# Patient Record
Sex: Male | Born: 1970 | Hispanic: No | Marital: Married | Smoking: Current some day smoker
Health system: Southern US, Community
[De-identification: ages and names within clinical notes are randomized; demographics above are authoritative.]

## PROBLEM LIST (undated history)

## (undated) DIAGNOSIS — T7840XA Allergy, unspecified, initial encounter: Secondary | ICD-10-CM

## (undated) DIAGNOSIS — A048 Other specified bacterial intestinal infections: Secondary | ICD-10-CM

## (undated) HISTORY — DX: Allergy, unspecified, initial encounter: T78.40XA

## (undated) HISTORY — DX: Other specified bacterial intestinal infections: A04.8

## (undated) HISTORY — PX: TONSILLECTOMY: SUR1361

---

## 2014-07-30 ENCOUNTER — Ambulatory Visit: Payer: Self-pay | Attending: Internal Medicine

## 2014-08-06 ENCOUNTER — Ambulatory Visit: Payer: Self-pay | Attending: Internal Medicine

## 2015-01-18 ENCOUNTER — Encounter (HOSPITAL_COMMUNITY): Payer: Self-pay | Admitting: Emergency Medicine

## 2015-01-18 ENCOUNTER — Emergency Department (HOSPITAL_COMMUNITY)
Admission: EM | Admit: 2015-01-18 | Discharge: 2015-01-18 | Disposition: A | Discharge status: Home / Self Care | Payer: Self-pay | Attending: Emergency Medicine | Admitting: Emergency Medicine

## 2015-01-18 ENCOUNTER — Emergency Department (HOSPITAL_COMMUNITY): Payer: Self-pay

## 2015-01-18 DIAGNOSIS — J189 Pneumonia, unspecified organism: Secondary | ICD-10-CM

## 2015-01-18 DIAGNOSIS — J029 Acute pharyngitis, unspecified: Secondary | ICD-10-CM | POA: Insufficient documentation

## 2015-01-18 DIAGNOSIS — H578 Other specified disorders of eye and adnexa: Secondary | ICD-10-CM | POA: Insufficient documentation

## 2015-01-18 DIAGNOSIS — Z792 Long term (current) use of antibiotics: Secondary | ICD-10-CM | POA: Insufficient documentation

## 2015-01-18 DIAGNOSIS — J159 Unspecified bacterial pneumonia: Secondary | ICD-10-CM | POA: Insufficient documentation

## 2015-01-18 DIAGNOSIS — Z79899 Other long term (current) drug therapy: Secondary | ICD-10-CM | POA: Insufficient documentation

## 2015-01-18 LAB — RAPID STREP SCREEN (MED CTR MEBANE ONLY): STREPTOCOCCUS, GROUP A SCREEN (DIRECT): NEGATIVE

## 2015-01-18 MED ORDER — AZITHROMYCIN 250 MG PO TABS: 250.0000 mg | ORAL_TABLET | Freq: Every day | ORAL | Status: DC

## 2015-01-18 MED ORDER — ALBUTEROL SULFATE HFA 108 (90 BASE) MCG/ACT IN AERS
2.0000 | INHALATION_SPRAY | Freq: Once | RESPIRATORY_TRACT | Status: AC
  Administered 2015-01-18: 2 via RESPIRATORY_TRACT
  Filled 2015-01-18: qty 6.7

## 2015-01-18 NOTE — ED Notes (Signed)
Pt. Stated, My nose its runny , my eyes are itchy, and my body feels like achy

## 2015-01-18 NOTE — Discharge Instructions (Signed)
1. Medications: azithromycin, usual home medications 2. Treatment: rest, drink plenty of fluids  3. Follow Up: please followup with your primary doctor for discussion of your diagnoses and further evaluation after today's visit; if you do not have a primary care doctor use the resource guide provided to find one; please return to the ER for high fever, severe chest pain or shortness of breath, new or worsening symptoms   Community-Acquired Pneumonia, Adult Pneumonia is an infection of the lungs. One type of pneumonia can happen while a person is in a hospital. A different type can happen when a person is not in a hospital (community-acquired pneumonia). It is easy for this kind to spread from person to person. It can spread to you if you breathe near an infected person who coughs or sneezes. Some symptoms include:  A dry cough.  A wet (productive) cough.  Fever.  Sweating.  Chest pain. HOME CARE  Take over-the-counter and prescription medicines only as told by your doctor.  Only take cough medicine if you are losing sleep.  If you were prescribed an antibiotic medicine, take it as told by your doctor. Do not stop taking the antibiotic even if you start to feel better.  Sleep with your head and neck raised (elevated). You can do this by putting a few pillows under your head, or you can sleep in a recliner.  Do not use tobacco products. These include cigarettes, chewing tobacco, and e-cigarettes. If you need help quitting, ask your doctor.  Drink enough water to keep your pee (urine) clear or pale yellow. A shot (vaccine) can help prevent pneumonia. Shots are often suggested for:  People older than 44 years of age.  People older than 44 years of age:  Who are having cancer treatment.  Who have long-term (chronic) lung disease.  Who have problems with their body's defense system (immune system). You may also prevent pneumonia if you take these actions:  Get the flu (influenza)  shot every year.  Go to the dentist as often as told.  Wash your hands often. If soap and water are not available, use hand sanitizer. GET HELP IF:  You have a fever.  You lose sleep because your cough medicine does not help. GET HELP RIGHT AWAY IF:  You are short of breath and it gets worse.  You have more chest pain.  Your sickness gets worse. This is very serious if:  You are an older adult.  Your body's defense system is weak.  You cough up blood.   This information is not intended to replace advice given to you by your health care provider. Make sure you discuss any questions you have with your health care provider.   Document Released: 08/31/2007 Document Revised: 12/03/2014 Document Reviewed: 07/09/2014 Elsevier Interactive Patient Education 2016 [employer].   Emergency Department Resource Guide 1) Find a Doctor and Pay Out of Pocket Although you won't have to find out who is covered by your insurance plan, it is a good idea to ask around and get recommendations. You will then need to call the office and see if the doctor you have chosen will accept you as a new patient and what types of options they offer for patients who are self-pay. Some doctors offer discounts or will set up payment plans for their patients who do not have insurance, but you will need to ask so you aren't surprised when you get to your appointment.  2) Contact Your Local Health Department Not  all health departments have doctors that can see patients for sick visits, but many do, so it is worth a call to see if yours does. If you don't know where your local health department is, you can check in your phone book. The CDC also has a tool to help you locate your state's health department, and many state websites also have listings of all of their local health departments.  3) Find a Walk-in Clinic If your illness is not likely to be very severe or complicated, you may want to try a walk in clinic.  These are popping up all over the country in pharmacies, drugstores, and shopping centers. They're usually staffed by nurse practitioners or physician assistants that have been trained to treat common illnesses and complaints. They're usually fairly quick and inexpensive. However, if you have serious medical issues or chronic medical problems, these are probably not your best option.  No Primary Care Doctor: - Call Health Connect at  [phone] - they can help you locate a primary care doctor that  accepts your insurance, provides certain services, etc. - Physician Referral Service- [phone]  Chronic Pain Problems: Organization         Address  Phone   Notes  [facility]  [phone] Patients need to be referred by their primary care doctor.   Medication Assistance: Organization         Address  Phone   Notes  [facility]ication [facility] [address] [place], [place] [address] [phone] --Must be a resident of [facility] -- Must have NO insurance coverage whatsoever (no Medicaid/ Medicare, etc.) -- The pt. MUST have a primary care doctor that directs their care regularly and follows them in the community   [facility]ist  [phone]   [employer]  [phone]    Agencies that provide inexpensive medical care: Organization         Address  Phone   Notes  [facility]  [phone]   [facility]    [phone]   [facility] [address] [place], [place] [address] [phone]   [facility] [address], [place] [phone]   Planned Parenthood    [phone]   [facility]    [phone]) [phone]   [facility]  [address], [place] Phone:  [phone]) [phone], Fax:  [phone] Hours of Operation:  9 am - 6 pm, M-F.  Also accepts Medicaid/Medicare and self-pay.  [facility]  [address], [place] Phone: [phone], Fax: [phone]. Hours of Operation:  8:30 am - 5:30 pm, M-F.  Also accepts Medicaid and self-pay.  [facility] [place] [address], [place] Phone: [phone]) [phone]   [facility] [address], [place], [place] [phone], Ext. [phone] Mondays & Thursdays: 7-9 AM.  First 15 patients are seen on a first come, first serve basis.    Medicaid-accepting [facility] Providers:  Organization         Address  Phone   Notes  [facility] [address], [place] [phone] Also accepts self-pay patients.  [facility] [address], [place]  [phone]   [facility] [address], [place] [phone]   [facility] [address], [place] [phone]   Renaye Rakers [address], [place]   [phone] Only accepts [place] Access [place] patients after they have their name applied to their card.   Self-Pay (no insurance) in [facility]:  Organization         Address  Phone   Notes  Sickle Cell Patients, [facility] [address] [place], [place] [phone]   [facility] [address] [place], [place] [phone]   [facility] [place]  [address], [place] [phone]   [facility]/Dr. Osei-Bonsu  [address], [place] or [address], [place] [phone] Phone number for both [place] and [place] locations is the same.  [facility] [address], [place] [phone]   [facility] [address], [place] or [address] [phone]   [facility] [address] [place] [address] [place] [phone], phone; [phone], fax Sees patients 1st  and 3rd Saturday of every month.  Must not qualify for public or private insurance (i.e. Medicaid, Medicare, [place] Health Choice, Veterans' Benefits)  Household income should be no more than 200% of the poverty level The clinic cannot treat you if you are pregnant or think you are pregnant  Sexually transmitted diseases are not treated at the clinic.    Dental Care: Organization         Address  Phone  Notes  [facility] [address] [place], [place] [phone] Accepts children up to age 35 who are enrolled in [place] or [place] Health Choice; pregnant women with a Medicaid card; and children who have applied for Medicaid or [place] Health Choice, but were declined, whose parents can pay a reduced fee at time of service.  [facility] of [facility]  [address], [place] [phone] Accepts children up to age 37 who are enrolled in [place] or [place] Health Choice; pregnant women with a Medicaid card; and children who have applied for Medicaid or [place] Health Choice, but were declined, whose parents can pay a reduced fee at time of service.  [facility]  [address] [place], [place] [phone] Patients are seen by appointment only. Walk-ins are not accepted. [facility] will see patients 57 years of age and older. Monday - Tuesday (8am-5pm) Most Wednesdays (8:30-5pm) $30 per visit, cash only  [facility] Adult Dental Access PROGRAM  [address], [facility] [phone] Patients are seen by appointment only. Walk-ins are not accepted. [facility] will see patients 45 years of age and older. One Wednesday Evening (Monthly: Volunteer Based).  $30 per visit, cash only  [employer]  [phone] for adults; Children under age 82, call Graduate Pediatric Dentistry at [phone]. Children aged 80-14, please call [phone] to request a pediatric  application.  Dental services are provided in all areas of dental care including fillings, crowns and bridges, complete and partial dentures, implants, gum treatment, root canals, and extractions. Preventive care is also provided. Treatment is provided to both adults and children. Patients are selected via a lottery and there is often a waiting list.   [facility] [address] [facility]  [phone] [email or web address]  [facility] [address] [place], [place] [phone], Ext. [phone] Second and Fourth Thursday of each month, opens at 6:30 AM; Clinic ends at 9 AM.  Patients are seen on a first-come first-served basis, and a limited number are seen during each clinic.   [facility]  [address] [place], [place] [phone]   Eligibility Requirements You must have lived in [place], [place], or [place] counties for at least the last three months.   You cannot be eligible for state or federal sponsored [place], including [employer], [place], or [employer].   You generally cannot be eligible for healthcare insurance through your employer.    How to apply: Eligibility screenings are held every Tuesday and Wednesday afternoon from 1:00 pm until 4:00 pm. You do not need an appointment for the interview!  [facility] [address], [place], [place] [phone]   [facility] Health Department  [phone]   [facility] Health Department  [phone]   [facility] Health Department  [phone]    Behavioral Health Resources in the Community: Intensive Outpatient Programs Organization         Address  Phone  Notes  [facility] [address], [place], [place] [phone]   [facility] [address] [place], [place] [phone]   [facility] [address], [place], [place]  [phone]   [facility]  [address],  [place], [place] [phone] or [phone]   Substance Abuse Resources Organization         Address  Phone  Notes  [facility]  [phone]   [facility]  [phone]   [facility] [place]  [phone]   Floydene Flock  [phone]   Residential & Outpatient Substance Abuse Program  [phone]   Psychological Services Organization         Address  Phone  Notes  [facility]  [phone]   [facility]  [phone]   [facility] Mental Health [address] [place] [phone] or [phone]    Mobile Crisis Teams Organization         Address  Phone  Notes  [facility], Mobile Crisis Care Unit  [phone]   [facility]  [address] [place], [place] [phone]   Doristine Locks [address] [place] [phone]    Self-Help/Support Groups Organization         Address  Phone             Notes  [facility] [place] - variety of support groups  336- I7437963 Call for more information  Narcotics Anonymous (NA), [facility] [address], [employer] [place]  2 meetings at this location   Statistician         Address  Phone  Notes  [facility] [address],    [place]  [phone]   [facility]  [address], [place] [address], [place], [place] [phone]   [facility] [address] [place], [place] [phone] Admissions: 8am-3pm M-F  [facility] [address]    [place], [place] [phone]   [facility] [address] [place], [place] [phone]   [facility] [address],  [place], [place] [phone]   [facility] - Intensive Outpatient [phone]  [address] [place], [place] [phone]   [facility] ([facility]iction [facility] [address]    [place], [place] [phone] or [phone]   [facility] ([facility]) [address], [place], [place] [phone] Accepts Medicaid  [facility] [address],  [place] [phone] Substance Abuse/Addiction Treatment   [facility] Organization         Address  Phone  Notes  [facility]  [phone]   Angie Fava, PhD [address] Ervin Knack [place], [place]   [phone] or [phone]   [facility] Behavioral   [address] [place], [place] [phone]   [facility] [address] [place], [place] [phone] Insurance/Medicaid/sponsorship through [facility] Families [address]                                    [place], [place] [phone] Therapy/tele-psych/case  [facility] [address], [place] [phone]    Dr. Lolly Mustache  [phone]   [facility]  [employer] [facility] Dept. 1) [address], [place] 2) [address] [place] 3)  [address], [place] [phone]  [phone]   [facility] Child Abuse Hotline 301-550-5416 or [phone] (After Hours)

## 2015-01-18 NOTE — ED Provider Notes (Signed)
CSN: [id number]     Arrival date & time 01/18/15  1320 History   First MD Initiated Contact with Patient 01/18/15 1336     Chief Complaint  Patient presents with  . Nasal Congestion  . Allergies  . Generalized Body Aches     HPI   Kyle Herrera is a 44 y.o. male with no pertinent PMH who presents to the ED with nasal congestion, itchy eyes, sneezing, and dry cough x 2-3 days. He states he has a history of allergies, and that this feels similar. He denies exacerbating factors. He has tried his home albuterol inhaler and zyrtec for symptom relief. He denies fever, chills, chest pain. He reports intermittent shortness of breath, which he states he has with allergies. He denies abdominal pain, N/V/D/C. He reports generalized body aches.   History reviewed. No pertinent past medical history. History reviewed. No pertinent past surgical history. No family history on file. Social History  Substance Use Topics  . Smoking status: Never Smoker   . Smokeless tobacco: None  . Alcohol Use: No    Review of Systems  Constitutional: Negative for fever and chills.  HENT: Positive for congestion, rhinorrhea, sneezing and sore throat. Negative for ear discharge and ear pain.   Eyes: Positive for itching.  Respiratory: Positive for shortness of breath.   Cardiovascular: Negative for chest pain.  Gastrointestinal: Negative for nausea, vomiting, abdominal pain, diarrhea and constipation.  Neurological: Negative for dizziness, light-headedness and headaches.  All other systems reviewed and are negative.     Allergies  Review of patient's allergies indicates no known allergies.  Home Medications   Prior to Admission medications   Medication Sig Start Date End Date Taking? Authorizing Provider  albuterol (PROVENTIL HFA;VENTOLIN HFA) 108 (90 BASE) MCG/ACT inhaler Inhale 1-2 puffs into the lungs every 6 (six) hours as needed for wheezing or shortness of breath.   Yes Historical Provider, MD   cetirizine (ZYRTEC) 10 MG tablet Take 10 mg by mouth daily.   Yes Historical Provider, MD    BP 118/78 mmHg  Pulse 72  Temp(Src) 97.8 F (36.6 C) (Oral)  Resp 16  Wt 181 lb 12.8 oz (82.464 kg)  SpO2 97% Physical Exam  Constitutional: He is oriented to person, place, and time. He appears well-developed and well-nourished. No distress.  HENT:  Head: Normocephalic and atraumatic.  Right Ear: Tympanic membrane, external ear and ear canal normal.  Left Ear: Tympanic membrane, external ear and ear canal normal.  Nose: Nose normal.  Mouth/Throat: Uvula is midline, oropharynx is clear and moist and mucous membranes are normal. No oropharyngeal exudate.  Eyes: Conjunctivae, EOM and lids are normal. Pupils are equal, round, and reactive to light. Right eye exhibits no discharge. Left eye exhibits no discharge. No scleral icterus.  Neck: Normal range of motion. Neck supple.  Cardiovascular: Normal rate, regular rhythm, normal heart sounds, intact distal pulses and normal pulses.   Pulmonary/Chest: Effort normal and breath sounds normal. No respiratory distress. He has no wheezes. He has no rales.  Abdominal: Soft. Normal appearance and bowel sounds are normal. He exhibits no distension and no mass. There is no tenderness. There is no rigidity, no rebound and no guarding.  Musculoskeletal: Normal range of motion. He exhibits no edema or tenderness.  Neurological: He is alert and oriented to person, place, and time.  Skin: Skin is warm, dry and intact. No rash noted. He is not diaphoretic. No erythema. No pallor.  Psychiatric: He has a normal mood  and affect. His speech is normal and behavior is normal.  Nursing note and vitals reviewed.   ED Course  Procedures (including critical care time)  Labs Review Labs Reviewed  RAPID STREP SCREEN (NOT AT [facility])  CULTURE, GROUP A STREP    Imaging Review Dg Chest 2 View  01/18/2015  CLINICAL DATA:  Sneezing, dry cough and runny nose for past few  days, shortness of breath, chest congestion, difficulty sleeping, trouble breathing EXAM: CHEST  2 VIEW COMPARISON:  None FINDINGS: Normal heart size, mediastinal contours, and pulmonary vascularity. Mild peribronchial thickening. Question subtle RIGHT upper lobe infiltrate. Remaining lungs clear. No pleural effusion or pneumothorax. Bones unremarkable. IMPRESSION: Bronchitic changes with questionable RIGHT upper lobe infiltrate. Electronically Signed   By: Ulyses SouthwardMark  Boles M.D.   On: 01/18/2015 15:09     I have personally reviewed and evaluated these images and lab results as part of my medical decision-making.   EKG Interpretation None      MDM   Final diagnoses:  CAP (community acquired pneumonia)    44 year old male presents with nasal congestion, itchy eyes, sneezing, and dry cough x 2-3 days. Reports associated sore throat and shortness of breath. States he has experienced similar symptoms in the past with allergies. Patient is afebrile. Vital signs stable. TMs clear. No nasal discharge. Posterior oropharynx clear. Heart RRR. Lungs clear to auscultation bilaterally. Abdomen soft, non-tender, non-distended. No lower extremity edema.  Will obtain rapid strep and CXR. PERC negative. Strep test negative. CXR reveals bronchitic changes with questionable right upper lobe infiltrate. Will treat with azithromycin. Advised patient he can continue using his albuterol inhaler and zyrtec for additional symptom relief. Patient is well-appearing, feel he is stable for discharge at this time. Return precautions discussed. Patient to follow-up with PCP.   BP 112/86 mmHg  Pulse 71  Temp(Src) 97.8 F (36.6 C) (Oral)  Resp 16  Wt 181 lb 12.8 oz (82.464 kg)  SpO2 98%     Mady Gemmalizabeth C Gustabo Gordillo, PA-C 01/18/15 1548  Lyndal Pulleyaniel Knott, MD 01/19/15 [phone]

## 2015-01-18 NOTE — ED Notes (Signed)
Pt reports sneezing, dry cough and runny nose for the past few days. Denies any fevers at home, denies pain at this time.

## 2015-01-20 LAB — CULTURE, GROUP A STREP: Strep A Culture: NEGATIVE

## 2015-01-22 ENCOUNTER — Ambulatory Visit: Payer: Self-pay | Attending: Family Medicine | Admitting: Family Medicine

## 2015-01-22 ENCOUNTER — Encounter: Payer: Self-pay | Admitting: Family Medicine

## 2015-01-22 VITALS — BP 121/70 | HR 70 | Temp 97.8°F | Resp 16 | Ht 69.5 in | Wt 178.0 lb

## 2015-01-22 DIAGNOSIS — J189 Pneumonia, unspecified organism: Secondary | ICD-10-CM | POA: Insufficient documentation

## 2015-01-22 DIAGNOSIS — R918 Other nonspecific abnormal finding of lung field: Secondary | ICD-10-CM | POA: Insufficient documentation

## 2015-01-22 DIAGNOSIS — R05 Cough: Secondary | ICD-10-CM | POA: Insufficient documentation

## 2015-01-22 DIAGNOSIS — Z9109 Other allergy status, other than to drugs and biological substances: Secondary | ICD-10-CM | POA: Insufficient documentation

## 2015-01-22 DIAGNOSIS — J181 Lobar pneumonia, unspecified organism: Secondary | ICD-10-CM

## 2015-01-22 DIAGNOSIS — Z91048 Other nonmedicinal substance allergy status: Secondary | ICD-10-CM

## 2015-01-22 MED ORDER — MONTELUKAST SODIUM 10 MG PO TABS: 10.0000 mg | ORAL_TABLET | Freq: Every day | ORAL | Status: DC

## 2015-01-22 MED ORDER — CETIRIZINE HCL 10 MG PO TABS: 10.0000 mg | ORAL_TABLET | Freq: Every day | ORAL | Status: DC

## 2015-01-22 MED ORDER — ALBUTEROL SULFATE HFA 108 (90 BASE) MCG/ACT IN AERS: 1.0000 | INHALATION_SPRAY | Freq: Four times a day (QID) | RESPIRATORY_TRACT | Status: DC | PRN

## 2015-01-22 MED ORDER — FLUTICASONE PROPIONATE 50 MCG/ACT NA SUSP: 2.0000 | Freq: Every day | NASAL | Status: DC

## 2015-01-22 NOTE — Assessment & Plan Note (Signed)
A: environmental allergies suspected. No wheezing today P: Add flonase and singulair Allergist referral

## 2015-01-22 NOTE — Progress Notes (Signed)
HFU season allergie wheezing and unable to sleep due to it  No Hx tobacco

## 2015-01-22 NOTE — Patient Instructions (Signed)
Macky LowerSabah was seen today for allergies.  Diagnoses and all orders for this visit:  Right upper lobe pneumonia -     DG Chest 2 View; Future  Environmental allergies -     fluticasone (FLONASE) 50 MCG/ACT nasal spray; Place 2 sprays into both nostrils daily. -     montelukast (SINGULAIR) 10 MG tablet; Take 1 tablet (10 mg total) by mouth at bedtime. -     Ambulatory referral to Allergy -     cetirizine (ZYRTEC) 10 MG tablet; Take 1 tablet (10 mg total) by mouth daily. -     albuterol (PROVENTIL HFA;VENTOLIN HFA) 108 (90 BASE) MCG/ACT inhaler; Inhale 1-2 puffs into the lungs every 6 (six) hours as needed for wheezing or shortness of breath.   Add flonase nasal spray and nightly singulair to your regimen to control allergies.  CXR in 8 weeks around 03/15/2015 to check for resolution of R upper lung possible pneumonia.   F/u with me in 12 weeks  Dr. Armen PickupFunches  Allergies An allergy is when your body reacts to a substance in a way that is not normal. An allergic reaction can happen after you:  Eat something.  Breathe in something.  Touch something. WHAT KINDS OF ALLERGIES ARE THERE? You can be allergic to:  Things that are only around during certain seasons, like molds and pollens.  Foods.  Drugs.  Insects.  Animal dander. WHAT ARE SYMPTOMS OF ALLERGIES?  Puffiness (swelling). This may happen on the lips, face, tongue, mouth, or throat.  Sneezing.  Coughing.  Breathing loudly (wheezing).  Stuffy nose.  Tingling in the mouth.  A rash.  Itching.  Itchy, red, puffy areas of skin (hives).  Watery eyes.  Throwing up (vomiting).  Watery poop (diarrhea).  Dizziness.  Feeling faint or fainting.  Trouble breathing or swallowing.  A tight feeling in the chest.  A fast heartbeat. HOW ARE ALLERGIES DIAGNOSED? Allergies can be diagnosed with:  A medical and family history.  Skin tests.  Blood tests.  A food diary. A food diary is a record of all the foods,  drinks, and symptoms you have each day.  The results of an elimination diet. This diet involves making sure not to eat certain foods and then seeing what happens when you start eating them again. HOW ARE ALLERGIES TREATED? There is no cure for allergies, but allergic reactions can be treated with medicine. Severe reactions usually need to be treated at a hospital.  HOW CAN REACTIONS BE PREVENTED? The best way to prevent an allergic reaction is to avoid the thing you are allergic to. Allergy shots and medicines can also help prevent reactions in some cases.   This information is not intended to replace advice given to you by your health care provider. Make sure you discuss any questions you have with your health care provider.   Document Released: 07/09/2012 Document Revised: 04/04/2014 Document Reviewed: 12/24/2013 Elsevier Interactive Patient Education [employer]2016 Elsevier Inc.

## 2015-01-22 NOTE — Progress Notes (Signed)
Patient ID: Kyle Herrera, male   DOB: 1970/12/04, 44 y.o.   MRN: [id number]   Subjective:  Patient ID: Kyle Herrera, male    DOB: [birth year]  Age: 44 y.o. MRN: [id number]  CC: Allergies   HPI Roma Schanz Ahmed presents for ED f/u  1. ? R upper lobe PNA: infiltrate seen on CXR in setting of cough, subjective wheezing and sore throat. Patient has completed course of azithromycin today. He denies fever. He has intermittent non-productive cough. No CP or SOB.  2. Allergies: patient with intermittent allergies since moving from [place] to [place] 3 years ago. He endorses allergies have worsened 1 year ago [place] He coughs and wheezes when he walks near the [place] and when at home. He has no wheezing when he is away from home. His cough is dry. He has a wife and two children who do not have symptoms. He uses albuterol at home which helps temporarily. He also takes  Zyrtec.   No past medical history on file.  No past surgical history on file.  No family history on file.  Social History  Substance Use Topics  . Smoking status: Never Smoker   . Smokeless tobacco: Not on file  . Alcohol Use: No    ROS Review of Systems  Constitutional: Negative for fever, chills, fatigue and unexpected weight change.  Eyes: Negative for visual disturbance.  Respiratory: Positive for cough and wheezing. Negative for shortness of breath.   Cardiovascular: Negative for chest pain, palpitations and leg swelling.  Gastrointestinal: Negative for nausea, vomiting, abdominal pain, diarrhea, constipation and blood in stool.  Endocrine: Negative for polydipsia, polyphagia and polyuria.  Musculoskeletal: Negative for myalgias, back pain, arthralgias, gait problem and neck pain.  Skin: Negative for rash.  Allergic/Immunologic: Negative for immunocompromised state.  Hematological: Negative for adenopathy. Does not bruise/bleed easily.  Psychiatric/Behavioral: Negative for  suicidal ideas, sleep disturbance and dysphoric mood. The patient is not nervous/anxious.     Objective:   Today's Vitals: BP 121/70 mmHg  Pulse 70  Temp(Src) 97.8 F (36.6 C) (Oral)  Resp 16  Ht 5' 9.5" (1.765 m)  Wt 178 lb (80.74 kg)  BMI 25.92 kg/m2  SpO2 97%  Physical Exam  Constitutional: He appears well-developed and well-nourished. No distress.  HENT:  Head: Normocephalic and atraumatic.  Nose: Mucosal edema present.  Neck: Normal range of motion. Neck supple.  Cardiovascular: Normal rate, regular rhythm, normal heart sounds and intact distal pulses.   Pulmonary/Chest: Effort normal and breath sounds normal.  Musculoskeletal: He exhibits no edema.  Neurological: He is alert.  Skin: Skin is warm and dry. No rash noted. No erythema.  Psychiatric: He has a normal mood and affect.    Assessment & Plan:   No problem-specific assessment & plan notes found for this encounter.   Outpatient Encounter Prescriptions as of 01/22/2015  Medication Sig  . albuterol (PROVENTIL HFA;VENTOLIN HFA) 108 (90 BASE) MCG/ACT inhaler Inhale 1-2 puffs into the lungs every 6 (six) hours as needed for wheezing or shortness of breath.  Marland Kitchen azithromycin (ZITHROMAX Z-PAK) 250 MG tablet Take 1 tablet (250 mg total) by mouth daily.  PO day 1, then  PO days 2-5  . cetirizine (ZYRTEC) 10 MG tablet Take 1 tablet (10 mg total) by mouth daily.  . [DISCONTINUED] albuterol (PROVENTIL HFA;VENTOLIN HFA) 108 (90 BASE) MCG/ACT inhaler Inhale 1-2 puffs into the lungs every 6 (six) hours as needed for wheezing or shortness  of breath.  . [DISCONTINUED] cetirizine (ZYRTEC) 10 MG tablet Take 10 mg by mouth daily.  . fluticasone (FLONASE) 50 MCG/ACT nasal spray Place 2 sprays into both nostrils daily.  . montelukast (SINGULAIR) 10 MG tablet Take 1 tablet (10 mg total) by mouth at bedtime.   No facility-administered encounter medications on file as of 01/22/2015.    Follow-up: No Follow-up on file.     Dessa PhiJosalyn Lindbergh Winkles MD

## 2015-01-22 NOTE — Assessment & Plan Note (Signed)
?   CAP. Treated with azithromycin.   Repeat CXR in 8 weeks around 03/15/2015 to check for resolution of R upper lung possible pneumonia.

## 2015-02-04 ENCOUNTER — Ambulatory Visit: Payer: Medicaid Other | Attending: Family Medicine

## 2015-02-25 ENCOUNTER — Ambulatory Visit: Payer: Medicaid Other | Attending: Family Medicine

## 2015-02-25 ENCOUNTER — Emergency Department (HOSPITAL_COMMUNITY)
Admission: EM | Admit: 2015-02-25 | Discharge: 2015-02-25 | Disposition: A | Discharge status: Home / Self Care | Payer: Medicaid Other | Attending: Emergency Medicine | Admitting: Emergency Medicine

## 2015-02-25 ENCOUNTER — Emergency Department (HOSPITAL_COMMUNITY): Payer: Medicaid Other

## 2015-02-25 ENCOUNTER — Encounter (HOSPITAL_COMMUNITY): Payer: Self-pay | Admitting: Emergency Medicine

## 2015-02-25 DIAGNOSIS — R519 Headache, unspecified: Secondary | ICD-10-CM

## 2015-02-25 DIAGNOSIS — J069 Acute upper respiratory infection, unspecified: Secondary | ICD-10-CM

## 2015-02-25 DIAGNOSIS — Z7951 Long term (current) use of inhaled steroids: Secondary | ICD-10-CM | POA: Insufficient documentation

## 2015-02-25 DIAGNOSIS — R067 Sneezing: Secondary | ICD-10-CM

## 2015-02-25 DIAGNOSIS — Z79899 Other long term (current) drug therapy: Secondary | ICD-10-CM | POA: Insufficient documentation

## 2015-02-25 DIAGNOSIS — R51 Headache: Secondary | ICD-10-CM

## 2015-02-25 LAB — CBC WITH DIFFERENTIAL/PLATELET
BASOS PCT: 1 %
Basophils Absolute: 0 10*3/uL (ref 0.0–0.1)
EOS ABS: 0.4 10*3/uL (ref 0.0–0.7)
Eosinophils Relative: 7 %
HEMATOCRIT: 43.3 % (ref 39.0–52.0)
Hemoglobin: 14.9 g/dL (ref 13.0–17.0)
Lymphocytes Relative: 34 %
Lymphs Abs: 1.9 10*3/uL (ref 0.7–4.0)
MCH: 30.7 pg (ref 26.0–34.0)
MCHC: 34.4 g/dL (ref 30.0–36.0)
MCV: 89.3 fL (ref 78.0–100.0)
MONO ABS: 0.3 10*3/uL (ref 0.1–1.0)
MONOS PCT: 5 %
NEUTROS ABS: 3.1 10*3/uL (ref 1.7–7.7)
Neutrophils Relative %: 53 %
Platelets: 211 10*3/uL (ref 150–400)
RBC: 4.85 MIL/uL (ref 4.22–5.81)
RDW: 12.8 % (ref 11.5–15.5)
WBC: 5.8 10*3/uL (ref 4.0–10.5)

## 2015-02-25 LAB — BASIC METABOLIC PANEL
Anion gap: 5 (ref 5–15)
BUN: 10 mg/dL (ref 6–20)
CALCIUM: 9.3 mg/dL (ref 8.9–10.3)
CO2: 26 mmol/L (ref 22–32)
CREATININE: 0.93 mg/dL (ref 0.61–1.24)
Chloride: 108 mmol/L (ref 101–111)
GFR calc non Af Amer: >60 mL/min (ref >60)
Glucose, Bld: 133 mg/dL — ABNORMAL HIGH (ref 65–99)
Potassium: 3.2 mmol/L — ABNORMAL LOW (ref 3.5–5.1)
Sodium: 139 mmol/L (ref 135–145)

## 2015-02-25 MED ORDER — POTASSIUM CHLORIDE CRYS ER 20 MEQ PO TBCR
40.0000 meq | EXTENDED_RELEASE_TABLET | Freq: Once | ORAL | Status: AC
  Administered 2015-02-25: 40 meq via ORAL
  Filled 2015-02-25: qty 2

## 2015-02-25 NOTE — Discharge Instructions (Signed)
It was our pleasure to provide your ER care today - we hope that you feel better.  Rest. Drink plenty of fluids.  Take mucinex or robitussin as need for cough/congestion.  Take tylenol/advil as need.   From today's lab tests, your potassium level is mildly low (3.2) - eat plenty of fruits and vegetables, and follow up with primary care doctor.   Follow up with primary care doctor in 1 week if symptoms fail to improve/resolve.  Return to ER if worse, new symptoms, severe pain, increased trouble breathing, other concern.      Upper Respiratory Infection, Adult Most upper respiratory infections (URIs) are a viral infection of the air passages leading to the lungs. A URI affects the nose, throat, and upper air passages. The most common type of URI is nasopharyngitis and is typically referred to as "the common cold." URIs run their course and usually go away on their own. Most of the time, a URI does not require medical attention, but sometimes a bacterial infection in the upper airways can follow a viral infection. This is called a secondary infection. Sinus and middle ear infections are common types of secondary upper respiratory infections. Bacterial pneumonia can also complicate a URI. A URI can worsen asthma and chronic obstructive pulmonary disease (COPD). Sometimes, these complications can require emergency medical care and may be life threatening.  CAUSES Almost all URIs are caused by viruses. A virus is a type of germ and can spread from one person to another.  RISKS FACTORS You may be at risk for a URI if:   You smoke.   You have chronic heart or lung disease.  You have a weakened defense (immune) system.   You are very young or very old.   You have nasal allergies or asthma.  You work in crowded or poorly ventilated areas.  You work in health care facilities or schools. SIGNS AND SYMPTOMS  Symptoms typically develop 2-3 days after you come in contact with a cold  virus. Most viral URIs last 7-10 days. However, viral URIs from the influenza virus (flu virus) can last 14-18 days and are typically more severe. Symptoms may include:   Runny or stuffy (congested) nose.   Sneezing.   Cough.   Sore throat.   Headache.   Fatigue.   Fever.   Loss of appetite.   Pain in your forehead, behind your eyes, and over your cheekbones (sinus pain).  Muscle aches.  DIAGNOSIS  Your health care provider may diagnose a URI by:  Physical exam.  Tests to check that your symptoms are not due to another condition such as:  Strep throat.  Sinusitis.  Pneumonia.  Asthma. TREATMENT  A URI goes away on its own with time. It cannot be cured with medicines, but medicines may be prescribed or recommended to relieve symptoms. Medicines may help:  Reduce your fever.  Reduce your cough.  Relieve nasal congestion. HOME CARE INSTRUCTIONS   Take medicines only as directed by your health care provider.   Gargle warm saltwater or take cough drops to comfort your throat as directed by your health care provider.  Use a warm mist humidifier or inhale steam from a shower to increase air moisture. This may make it easier to breathe.  Drink enough fluid to keep your urine clear or pale yellow.   Eat soups and other clear broths and maintain good nutrition.   Rest as needed.   Return to work when your temperature has returned to  normal or as your health care provider advises. You may need to stay home longer to avoid infecting others. You can also use a face mask and careful hand washing to prevent spread of the virus.  Increase the usage of your inhaler if you have asthma.   Do not use any tobacco products, including cigarettes, chewing tobacco, or electronic cigarettes. If you need help quitting, ask your health care provider. PREVENTION  The best way to protect yourself from getting a cold is to practice good hygiene.   Avoid oral or hand  contact with people with cold symptoms.   Wash your hands often if contact occurs.  There is no clear evidence that vitamin C, vitamin E, echinacea, or exercise reduces the chance of developing a cold. However, it is always recommended to get plenty of rest, exercise, and practice good nutrition.  SEEK MEDICAL CARE IF:   You are getting worse rather than better.   Your symptoms are not controlled by medicine.   You have chills.  You have worsening shortness of breath.  You have brown or red mucus.  You have yellow or brown nasal discharge.  You have pain in your face, especially when you bend forward.  You have a fever.  You have swollen neck glands.  You have pain while swallowing.  You have white areas in the back of your throat. SEEK IMMEDIATE MEDICAL CARE IF:   You have severe or persistent:  Headache.  Ear pain.  Sinus pain.  Chest pain.  You have chronic lung disease and any of the following:  Wheezing.  Prolonged cough.  Coughing up blood.  A change in your usual mucus.  You have a stiff neck.  You have changes in your:  Vision.  Hearing.  Thinking.  Mood. MAKE SURE YOU:   Understand these instructions.  Will watch your condition.  Will get help right away if you are not doing well or get worse.   This information is not intended to replace advice given to you by your health care provider. Make sure you discuss any questions you have with your health care provider.   Document Released: 09/07/2000 Document Revised: 07/29/2014 Document Reviewed: 06/19/2013 Elsevier Interactive Patient Education 2016 Elsevier Inc.   Viral Infections A viral infection can be caused by different types of viruses.Most viral infections are not serious and resolve on their own. However, some infections may cause severe symptoms and may lead to further complications. SYMPTOMS Viruses can frequently cause:  Minor sore throat.  Aches and  pains.  Headaches.  Runny nose.  Different types of rashes.  Watery eyes.  Tiredness.  Cough.  Loss of appetite.  Gastrointestinal infections, resulting in nausea, vomiting, and diarrhea. These symptoms do not respond to antibiotics because the infection is not caused by bacteria. However, you might catch a bacterial infection following the viral infection. This is sometimes called a "superinfection." Symptoms of such a bacterial infection may include:  Worsening sore throat with pus and difficulty swallowing.  Swollen neck glands.  Chills and a high or persistent fever.  Severe headache.  Tenderness over the sinuses.  Persistent overall ill feeling (malaise), muscle aches, and tiredness (fatigue).  Persistent cough.  Yellow, green, or brown mucus production with coughing. HOME CARE INSTRUCTIONS   Only take over-the-counter or prescription medicines for pain, discomfort, diarrhea, or fever as directed by your caregiver.  Drink enough water and fluids to keep your urine clear or pale yellow. Sports drinks can provide valuable electrolytes,  sugars, and hydration.  Get plenty of rest and maintain proper nutrition. Soups and broths with crackers or rice are fine. SEEK IMMEDIATE MEDICAL CARE IF:   You have severe headaches, shortness of breath, chest pain, neck pain, or an unusual rash.  You have uncontrolled vomiting, diarrhea, or you are unable to keep down fluids.  You or your child has an oral temperature above 102 F (38.9 C), not controlled by medicine.  Your baby is older than 3 months with a rectal temperature of 102 F (38.9 C) or higher.  Your baby is 363 months old or younger with a rectal temperature of 100.4 F (38 C) or higher. MAKE SURE YOU:   Understand these instructions.  Will watch your condition.  Will get help right away if you are not doing well or get worse.   This information is not intended to replace advice given to you by your health  care provider. Make sure you discuss any questions you have with your health care provider.   Document Released: 12/22/2004 Document Revised: 06/06/2011 Document Reviewed: 08/20/2014 Elsevier Interactive Patient Education 2016 [employer]  Hypokalemia Hypokalemia means that the amount of potassium in the blood is lower than normal.Potassium is a chemical, called an electrolyte, that helps regulate the amount of fluid in the body. It also stimulates muscle contraction and helps nerves function properly.Most of the body's potassium is inside of cells, and only a very small amount is in the blood. Because the amount in the blood is so small, minor changes can be life-threatening. CAUSES  Antibiotics.  Diarrhea or vomiting.  Using laxatives too much, which can cause diarrhea.  Chronic kidney disease.  Water pills (diuretics).  Eating disorders (bulimia).  Low magnesium level.  Sweating a lot. SIGNS AND SYMPTOMS  Weakness.  Constipation.  Fatigue.  Muscle cramps.  Mental confusion.  Skipped heartbeats or irregular heartbeat (palpitations).  Tingling or numbness. DIAGNOSIS  Your health care provider can diagnose hypokalemia with blood tests. In addition to checking your potassium level, your health care provider may also check other lab tests. TREATMENT Hypokalemia can be treated with potassium supplements taken by mouth or adjustments in your current medicines. If your potassium level is very low, you may need to get potassium through a vein (IV) and be monitored in the hospital. A diet high in potassium is also helpful. Foods high in potassium are:  Nuts, such as peanuts and pistachios.  Seeds, such as sunflower seeds and pumpkin seeds.  Peas, lentils, and lima beans.  Whole grain and bran cereals and breads.  Fresh fruit and vegetables, such as apricots, avocado, bananas, cantaloupe, kiwi, oranges, tomatoes, asparagus, and potatoes.  Orange and tomato  juices.  Red meats.  Fruit yogurt. HOME CARE INSTRUCTIONS  Take all medicines as prescribed by your health care provider.  Maintain a healthy diet by including nutritious food, such as fruits, vegetables, nuts, whole grains, and lean meats.  If you are taking a laxative, be sure to follow the directions on the label. SEEK MEDICAL CARE IF:  Your weakness gets worse.  You feel your heart pounding or racing.  You are vomiting or having diarrhea.  You are diabetic and having trouble keeping your blood glucose in the normal range. SEEK IMMEDIATE MEDICAL CARE IF:  You have chest pain, shortness of breath, or dizziness.  You are vomiting or having diarrhea for more than 2 days.  You faint. MAKE SURE YOU:   Understand these instructions.  Will watch your condition.  Will get help right away if you are not doing well or get worse.   This information is not intended to replace advice given to you by your health care provider. Make sure you discuss any questions you have with your health care provider.   Document Released: 03/14/2005 Document Revised: 04/04/2014 Document Reviewed: 09/14/2012 Elsevier Interactive Patient Education [employer]

## 2015-02-25 NOTE — ED Notes (Signed)
Pt. reports SOB and headache onset today , denies fever or cough .

## 2015-02-25 NOTE — ED Provider Notes (Signed)
CSN: [id number]     Arrival date & time 02/25/15  1958 History   First MD Initiated Contact with Patient 02/25/15 2215     Chief Complaint  Patient presents with  . Shortness of Breath  . Headache     (Consider location/radiation/quality/duration/timing/severity/associated sxs/prior Treatment) Patient is a 44 y.o. male presenting with shortness of breath and headaches. The history is provided by the patient.  Shortness of Breath Associated symptoms: cough and headaches   Associated symptoms: no abdominal pain, no chest pain, no fever, no neck pain, no rash, no sore throat and no vomiting   Headache Associated symptoms: congestion and cough   Associated symptoms: no abdominal pain, no back pain, no fever, no neck pain, no sore throat and no vomiting   pt indicates started sneezing 2 days ago, nasal congestion, mild intermittent frontal headaches, and intermittent non prod cough. Felt mild sob earlier, now fine. No chest pain or discomfort. No fever or chills. States last month had pna, so wanted to get checked. No sore throat. No abd pain or nv. No swelling or leg pain.      History reviewed. No pertinent past medical history. History reviewed. No pertinent past surgical history. No family history on file. Social History  Substance Use Topics  . Smoking status: Never Smoker   . Smokeless tobacco: Never Used  . Alcohol Use: No    Review of Systems  Constitutional: Negative for fever.  HENT: Positive for congestion and sneezing. Negative for sore throat.   Eyes: Negative for discharge and redness.  Respiratory: Positive for cough and shortness of breath.   Cardiovascular: Negative for chest pain and leg swelling.  Gastrointestinal: Negative for vomiting and abdominal pain.  Genitourinary: Negative for flank pain.  Musculoskeletal: Negative for back pain and neck pain.  Skin: Negative for rash.  Neurological: Positive for headaches.  Hematological: Does not bruise/bleed  easily.  Psychiatric/Behavioral: Negative for confusion.      Allergies  Review of patient's allergies indicates no known allergies.  Home Medications   Prior to Admission medications   Medication Sig Start Date End Date Taking? Authorizing Provider  albuterol (PROVENTIL HFA;VENTOLIN HFA) 108 (90 BASE) MCG/ACT inhaler Inhale 1-2 puffs into the lungs every 6 (six) hours as needed for wheezing or shortness of breath. 01/22/15   Josalyn Funches, MD  azithromycin (ZITHROMAX Z-PAK) 250 MG tablet Take 1 tablet (250 mg total) by mouth daily.  PO day 1, then  PO days 2-5 01/18/15   Mady Gemma, PA-C  cetirizine (ZYRTEC) 10 MG tablet Take 1 tablet (10 mg total) by mouth daily. 01/22/15   Josalyn Funches, MD  fluticasone (FLONASE) 50 MCG/ACT nasal spray Place 2 sprays into both nostrils daily. 01/22/15   Josalyn Funches, MD  montelukast (SINGULAIR) 10 MG tablet Take 1 tablet (10 mg total) by mouth at bedtime. 01/22/15   Josalyn Funches, MD   BP 109/71 mmHg  Pulse 60  Temp(Src) 97.6 F (36.4 C) (Oral)  Resp 16  SpO2 98% Physical Exam  Constitutional: He is oriented to person, place, and time. He appears well-developed and well-nourished. No distress.  HENT:  Head: Atraumatic.  Mouth/Throat: Oropharynx is clear and moist.  No sinus or temporal tenderness.   Eyes: Conjunctivae are normal. Pupils are equal, round, and reactive to light.  Neck: Neck supple. No tracheal deviation present.  No stiffness or rigidity  Cardiovascular: Normal rate, regular rhythm, normal heart sounds and intact distal pulses.  Exam reveals no gallop and no  friction rub.   No murmur heard. Pulmonary/Chest: Effort normal and breath sounds normal. No accessory muscle usage. No respiratory distress.  Abdominal: Soft. Bowel sounds are normal. He exhibits no distension. There is no tenderness.  Musculoskeletal: Normal range of motion. He exhibits no edema or tenderness.  Neurological: He is alert and  oriented to person, place, and time.  Skin: Skin is warm and dry. No rash noted. He is not diaphoretic.  Psychiatric: He has a normal mood and affect.  Nursing note and vitals reviewed.   ED Course  Procedures (including critical care time) Labs Review  Results for orders placed or performed during the hospital encounter of 02/25/15  CBC with Differential  Result Value Ref Range   WBC 5.8 4.0 - 10.5 K/uL   RBC 4.85 4.22 - 5.81 MIL/uL   Hemoglobin 14.9 13.0 - 17.0 g/dL   HCT 16.143.3 09.639.0 - 04.552.0 %   MCV 89.3 78.0 - 100.0 fL   MCH 30.7 26.0 - 34.0 pg   MCHC 34.4 30.0 - 36.0 g/dL   RDW 40.912.8 81.111.5 - 91.415.5 %   Platelets 211 150 - 400 K/uL   Neutrophils Relative % 53 %   Neutro Abs 3.1 1.7 - 7.7 K/uL   Lymphocytes Relative 34 %   Lymphs Abs 1.9 0.7 - 4.0 K/uL   Monocytes Relative 5 %   Monocytes Absolute 0.3 0.1 - 1.0 K/uL   Eosinophils Relative 7 %   Eosinophils Absolute 0.4 0.0 - 0.7 K/uL   Basophils Relative 1 %   Basophils Absolute 0.0 0.0 - 0.1 K/uL  Basic metabolic panel  Result Value Ref Range   Sodium 139 135 - 145 mmol/L   Potassium 3.2 (L) 3.5 - 5.1 mmol/L   Chloride 108 101 - 111 mmol/L   CO2 26 22 - 32 mmol/L   Glucose, Bld 133 (H) 65 - 99 mg/dL   BUN 10 6 - 20 mg/dL   Creatinine, Ser 7.820.93 0.61 - 1.24 mg/dL   Calcium 9.3 8.9 - 95.610.3 mg/dL   GFR calc non Af Amer >60 >60 mL/min   GFR calc Af Amer >60 >60 mL/min   Anion gap 5 5 - 15   Dg Chest 2 View  02/25/2015  CLINICAL DATA:  Shortness of breath, headache EXAM: CHEST  2 VIEW COMPARISON:  01/18/2015 FINDINGS: The heart size and mediastinal contours are within normal limits. Both lungs are clear. The visualized skeletal structures are unremarkable. IMPRESSION: No active cardiopulmonary disease. Electronically Signed   By: Elige KoHetal  Patel   On: 02/25/2015 21:14   '   I have personally reviewed and evaluated these images and lab results as part of my medical decision-making.   EKG Interpretation   Date/Time:   Wednesday February 25 2015 20:08:32 EST Ventricular Rate:  77 PR Interval:  162 QRS Duration: 84 QT Interval:  360 QTC Calculation: 407 R Axis:   92 Text Interpretation:  Normal sinus rhythm Rightward axis Nonspecific T  wave abnormality No previous tracing Confirmed by Denton LankSTEINL  MD, Caryn BeeKEVIN  ([id number]) on 02/25/2015 10:17:20 PM      MDM   Cxr.  Reviewed nursing notes and prior charts for additional history.   Symptoms, and exam, felt most c/w viral uri.    Pt appears stable for d/c.     Cathren LaineKevin Anyely Cunning, MD 02/25/15 2220

## 2015-03-16 ENCOUNTER — Ambulatory Visit (HOSPITAL_COMMUNITY)
Admission: RE | Admit: 2015-03-16 | Discharge: 2015-03-16 | Disposition: A | Discharge status: Home / Self Care | Payer: Medicaid Other | Source: Ambulatory Visit | Attending: Family Medicine | Admitting: Family Medicine

## 2015-03-16 DIAGNOSIS — J189 Pneumonia, unspecified organism: Secondary | ICD-10-CM | POA: Insufficient documentation

## 2015-03-16 DIAGNOSIS — J181 Lobar pneumonia, unspecified organism: Secondary | ICD-10-CM

## 2015-03-25 ENCOUNTER — Telehealth: Payer: Self-pay | Admitting: *Deleted

## 2015-03-25 NOTE — Telephone Encounter (Signed)
-----   Message from Dessa PhiJosalyn Funches, MD sent at 03/16/2015 11:47 AM EST ----- Normal repeat chest x-ray

## 2015-03-25 NOTE — Telephone Encounter (Signed)
Used [employer]ter [phone]#[id number] Date of birth verified by pt  Normal X- ray result given  Pt verbalized understanding

## 2015-05-07 ENCOUNTER — Institutional Professional Consult (permissible substitution): Payer: Self-pay | Admitting: Internal Medicine

## 2015-05-12 ENCOUNTER — Institutional Professional Consult (permissible substitution): Payer: Self-pay | Admitting: Internal Medicine

## 2015-11-02 ENCOUNTER — Ambulatory Visit: Payer: Medicaid Other | Admitting: Family Medicine

## 2015-11-16 ENCOUNTER — Ambulatory Visit: Payer: Self-pay | Attending: Family Medicine | Admitting: Family Medicine

## 2015-11-16 ENCOUNTER — Encounter: Payer: Self-pay | Admitting: Family Medicine

## 2015-11-16 VITALS — BP 107/70 | HR 74 | Temp 97.5°F | Ht 69.0 in | Wt 188.0 lb

## 2015-11-16 DIAGNOSIS — L639 Alopecia areata, unspecified: Secondary | ICD-10-CM

## 2015-11-16 DIAGNOSIS — Z0000 Encounter for general adult medical examination without abnormal findings: Secondary | ICD-10-CM

## 2015-11-16 DIAGNOSIS — R21 Rash and other nonspecific skin eruption: Secondary | ICD-10-CM | POA: Insufficient documentation

## 2015-11-16 MED ORDER — MINOXIDIL 5 % EX SOLN: 1.0000 mL | Freq: Two times a day (BID) | CUTANEOUS | 2 refills | Status: DC

## 2015-11-16 NOTE — Patient Instructions (Addendum)
Kyle Herrera was seen today for rash.  Diagnoses and all orders for this visit:  Alopecia areata -     MINOXIDIL, TOPICAL, 5 % SOLN; Apply 1 mL topically 2 (two) times daily. To chin      Please make an appt with financial to finalize orange card renewal and [place] discount   F/u in 3 months for alopecia on chin   Dr. Armen PickupFunches   Alopecia Areata Alopecia areata is a type of hair loss. If you have this condition, you may lose hair on your scalp in patches. In some cases, you may lose all the hair on your scalp (alopecia totalis) or all the hair from your face and body (alopecia universalis).  Alopecia areata is an autoimmune disease. This means your body's defense system (immune system) mistakes normal parts of your body for germs or other things that can make you sick. When you have alopecia areata, your immune system attacks your hair follicles.  Alopecia areata often starts during childhood but can occur at any age. Alopecia areata is not a danger to your health but can be stressful.  CAUSES  The cause of alopecia areata is unknown.  RISK FACTORS You may be at higher risk of alopecia areata if you:   Have a family history of alopecia.  Have a family history of another autoimmune disease, including type 1 diabetes and rheumatoid arthritis. SIGNS AND SYMPTOMS Signs of alopecia areata may include:  Loss of scalp hair in small, round patches. These may be about the size of a quarter.  Loss of all hair on your scalp.  Loss of eyebrow hair, facial hair, or the hair inside your nose (nasal hair).  Hair loss over your entire body. DIAGNOSIS  Alopecia areata may be diagnosed by:  Medical history and physical exam.  Taking a sample of hair to check under a microscope.  Taking a small piece of skin (biopsy) to examine under a microscope.  Blood tests to rule out other autoimmune diseases. TREATMENT  There is no cure for alopecia areata, but the disease often goes away over time.  You will not lose the ability to regrow hair. Some medicines may help your hair regrow more quickly. These include:  Corticosteroids. These block inflammation caused by your immune system. You may get this medicine as a lotion for your skin or as an injection.  Minoxidil. This is a hair growth medicine you can use in areas of hair loss.  Anthralin. This is a medicine for a skin inflammation called psoriasis that may also help alopecia.  Diphencyprone. This medicine is applied to your skin and may stimulate hair growth. HOME CARE INSTRUCTIONS  Use sunscreen or cover your head when outdoors.  Take medicines only as directed by your health care provider.  If you have lost your eyebrows, wear sunglasses outside to keep dust out of your eyes.  If you have lost hair inside your nose, wear a kerchief over your face or apply ointment to the inside of your nose. This keeps out dust and other irritants.  Keep all follow-up visits as directed by your health care provider. This is important. SEEK MEDICAL CARE IF:  Your symptoms change.  You have new symptoms.  You have a reaction to your medicines.  You are struggling emotionally.   This information is not intended to replace advice given to you by your health care provider. Make sure you discuss any questions you have with your health care provider.   Document Released:  10/17/2003 Document Revised: 04/04/2014 Document Reviewed: 06/03/2013 Elsevier Interactive Patient Education [employer]2016 Elsevier Inc.

## 2015-11-16 NOTE — Progress Notes (Signed)
C/C: rash on chin

## 2015-11-16 NOTE — Progress Notes (Signed)
Subjective:  Patient ID: Kyle SlaterSabah Faraj Herrera, male    DOB: 29-Jun-1970  Age: 45 y.o. MRN: [id number]  CC: Rash (on chin)   HPI Kyle SchanzSabah Faraj Herrera has hx of CAP last year he presents for    1. Hair loss: on chin for past 6 weeks. Painless. He has hx of thinning hair on scalp 2 years ago that improved with what sounds like rogaine. He denies itching and skin discoloration.  2. HM: amenable to flu shot. He is uninsured. He will renew orange card and [place] discount.   Social History  Substance Use Topics  . Smoking status: Never Smoker  . Smokeless tobacco: Never Used  . Alcohol use No    Outpatient Medications Prior to Visit  Medication Sig Dispense Refill  . albuterol (PROVENTIL HFA;VENTOLIN HFA) 108 (90 BASE) MCG/ACT inhaler Inhale 1-2 puffs into the lungs every 6 (six) hours as needed for wheezing or shortness of breath. 8 g 3  . azithromycin (ZITHROMAX Z-PAK) 250 MG tablet Take 1 tablet (250 mg total) by mouth daily. 500mg  PO day 1, then 250mg  PO days 2-5 6 tablet 0  . cetirizine (ZYRTEC) 10 MG tablet Take 1 tablet (10 mg total) by mouth daily. 30 tablet 5  . montelukast (SINGULAIR) 10 MG tablet Take 1 tablet (10 mg total) by mouth at bedtime. 30 tablet 3  . fluticasone (FLONASE) 50 MCG/ACT nasal spray Place 2 sprays into both nostrils daily. (Patient not taking: Reported on 11/16/2015) 16 g 6   No facility-administered medications prior to visit.     ROS Review of Systems  Constitutional: Negative for chills, fatigue, fever and unexpected weight change.  Eyes: Negative for visual disturbance.  Respiratory: Negative for cough, shortness of breath and wheezing.   Cardiovascular: Negative for chest pain, palpitations and leg swelling.  Gastrointestinal: Negative for abdominal pain, blood in stool, constipation, diarrhea, nausea and vomiting.  Endocrine: Negative for polydipsia, polyphagia and polyuria.  Musculoskeletal: Negative for arthralgias, back pain, gait problem,  myalgias and neck pain.  Skin: Positive for rash.  Allergic/Immunologic: Negative for immunocompromised state.  Hematological: Negative for adenopathy. Does not bruise/bleed easily.  Psychiatric/Behavioral: Negative for dysphoric mood, sleep disturbance and suicidal ideas. The patient is not nervous/anxious.     Objective:  BP 107/70 (BP Location: Left Arm, Patient Position: Sitting, Cuff Size: Large)   Pulse 74   Temp 97.5 F (36.4 C) (Oral)   Ht 5\' 9"  (1.753 m)   Wt 188 lb (85.3 kg)   SpO2 97%   BMI 27.76 kg/m   BP/Weight 11/16/2015 02/25/2015 01/22/2015  Systolic BP 107 109 121  Diastolic BP 70 67 70  Wt. (Lbs) 188 - 178  BMI 27.76 - 25.92   Physical Exam  Constitutional: He appears well-developed and well-nourished. No distress.  HENT:  Head: Normocephalic and atraumatic.    No hair loss from scalp on pull test   Neck: Normal range of motion. Neck supple.  Cardiovascular: Normal rate, regular rhythm, normal heart sounds and intact distal pulses.   Pulmonary/Chest: Effort normal and breath sounds normal.  Musculoskeletal: He exhibits no edema.  Neurological: He is alert.  Skin: Skin is warm and dry. No rash noted. No erythema.  Psychiatric: He has a normal mood and affect.     Assessment & Plan:   There are no diagnoses linked to this encounter.  No orders of the defined types were placed in this encounter.   Follow-up: No Follow-up on file.   Mahayla Haddaway  MD   

## 2015-11-16 NOTE — Assessment & Plan Note (Signed)
Alopecia areata on chin rogaine 5% BID

## 2015-11-26 ENCOUNTER — Ambulatory Visit: Payer: Medicaid Other | Attending: Internal Medicine

## 2016-02-22 ENCOUNTER — Ambulatory Visit: Payer: Medicaid Other

## 2016-02-23 ENCOUNTER — Ambulatory Visit: Payer: Medicaid Other | Attending: Family Medicine

## 2016-03-09 ENCOUNTER — Ambulatory Visit: Payer: Medicaid Other | Attending: Family Medicine

## 2016-06-07 ENCOUNTER — Ambulatory Visit: Payer: Self-pay | Attending: Family Medicine

## 2016-08-10 ENCOUNTER — Encounter: Payer: Self-pay | Admitting: Family Medicine

## 2016-09-23 ENCOUNTER — Ambulatory Visit: Payer: Medicaid Other

## 2016-09-26 ENCOUNTER — Ambulatory Visit: Payer: Self-pay | Attending: Family Medicine

## 2016-10-17 ENCOUNTER — Encounter: Payer: Self-pay | Admitting: Family Medicine

## 2016-10-17 ENCOUNTER — Ambulatory Visit: Payer: Self-pay | Attending: Family Medicine | Admitting: Family Medicine

## 2016-10-17 VITALS — BP 99/63 | HR 60 | Temp 98.3°F | Resp 18 | Ht 70.0 in | Wt 180.8 lb

## 2016-10-17 DIAGNOSIS — Z114 Encounter for screening for human immunodeficiency virus [HIV]: Secondary | ICD-10-CM | POA: Insufficient documentation

## 2016-10-17 DIAGNOSIS — Z0000 Encounter for general adult medical examination without abnormal findings: Secondary | ICD-10-CM

## 2016-10-17 DIAGNOSIS — Z79899 Other long term (current) drug therapy: Secondary | ICD-10-CM | POA: Insufficient documentation

## 2016-10-17 DIAGNOSIS — Z23 Encounter for immunization: Secondary | ICD-10-CM | POA: Insufficient documentation

## 2016-10-17 DIAGNOSIS — L659 Nonscarring hair loss, unspecified: Secondary | ICD-10-CM

## 2016-10-17 MED ORDER — MINOXIDIL 5 % EX SOLN: 1.0000 mL | Freq: Two times a day (BID) | CUTANEOUS | 2 refills | Status: DC

## 2016-10-17 NOTE — Progress Notes (Signed)
Subjective:  Patient ID: Kyle Herrera, male    DOB: 10/10/1970  Age: 46 y.o. MRN: [id number]  CC: Establish Care   HPI Kyle Herrera presents for complains of hair loss.  The hair loss is diffuse in distribution, with onset approximately 1 month ago. Patient describes symptoms of hair shedding. Patient denies scalp itch, scalp pain, scalp rash/ lesions and scalp scaling. Patient does not wear a high tension hair style. Patient does not have serious medical illnesses or major weight loss during time of hair loss. He denies any easy bruising or easy bleeding. He denies use hair dyes or new shampoos. He reports using bar soap instead of shampoo.    Outpatient Medications Prior to Visit  Medication Sig Dispense Refill  . cetirizine (ZYRTEC) 10 MG tablet Take 1 tablet (10 mg total) by mouth daily. 30 tablet 5  . albuterol (PROVENTIL HFA;VENTOLIN HFA) 108 (90 BASE) MCG/ACT inhaler Inhale 1-2 puffs into the lungs every 6 (six) hours as needed for wheezing or shortness of breath. 8 g 3  . fluticasone (FLONASE) 50 MCG/ACT nasal spray Place 2 sprays into both nostrils daily. (Patient not taking: Reported on 11/16/2015) 16 g 6  . montelukast (SINGULAIR) 10 MG tablet Take 1 tablet (10 mg total) by mouth at bedtime. 30 tablet 3  . MINOXIDIL, TOPICAL, 5 % SOLN Apply 1 mL topically 2 (two) times daily. To chin 120 mL 2   No facility-administered medications prior to visit.     ROS Review of Systems  Constitutional: Negative.   HENT: Negative.   Eyes: Negative.   Respiratory: Negative.   Cardiovascular: Negative.   Gastrointestinal: Negative.   Skin:       Hair loss   Objective:  BP 99/63 (BP Location: Left Arm, Patient Position: Sitting, Cuff Size: Normal)   Pulse 60   Temp 98.3 F (36.8 C) (Oral)   Resp 18   Ht 5\' 10"  (1.778 m)   Wt 180 lb 12.8 oz (82 kg)   SpO2 98%   BMI 25.94 kg/m   BP/Weight 10/17/2016 11/16/2015 02/25/2015  Systolic BP 99 107 109  Diastolic BP 63 70 67    Wt. (Lbs) 180.8 188 -  BMI 25.94 27.76 -     Physical Exam  Constitutional: He appears well-developed and well-nourished.  HENT:  Head: Normocephalic and atraumatic.  Right Ear: External ear normal.  Left Ear: External ear normal.  Nose: Nose normal.  Mouth/Throat: Oropharynx is clear and moist.  Eyes: Pupils are equal, round, and reactive to light. Conjunctivae are normal.  Neck: No JVD present.  Cardiovascular: Normal rate, regular rhythm, normal heart sounds and intact distal pulses.   Pulmonary/Chest: Effort normal and breath sounds normal.  Abdominal: Soft. Bowel sounds are normal. There is no tenderness.  Skin: Skin is warm and dry. No rash noted.  No scaling of the scalp seen, no localized areas of hair thinning.  Psychiatric: He has a normal mood and affect.  Nursing note and vitals reviewed.   Assessment & Plan:   Problem List Items Addressed This Visit    None    Visit Diagnoses    Alopecia    -  Primary   Relevant Medications   MINOXIDIL, TOPICAL, 5 % SOLN   Other Relevant Orders   TSH (Completed)   CBC with Differential (Completed)   Vitamin D, 25-hydroxy (Completed)   Healthcare maintenance       Relevant Orders   Tdap vaccine greater than or equal to  7yo IM (Completed)   HIV antibody (with reflex) (Completed)      Meds ordered this encounter  Medications  . DISCONTD: MINOXIDIL, TOPICAL, 5 % SOLN    Sig: Apply 1 mL topically 2 (two) times daily. To scalp.    Dispense:  120 mL    Refill:  2    Order Specific Question:   Supervising Provider    Answer:   Quentin AngstJEGEDE, OLUGBEMIGA E [id number]]  . MINOXIDIL, TOPICAL, 5 % SOLN    Sig: Apply 1 mL topically 2 (two) times daily. To scalp.    Dispense:  120 mL    Refill:  2    Follow-up: Return if symptoms worsen or fail to improve.   Lizbeth BarkMandesia R Jaelene Garciagarcia FNP

## 2016-10-17 NOTE — Patient Instructions (Signed)
You will be called with your labs results.  Alopecia Areata, Adult Alopecia areata is a condition that causes you to lose hair. You may lose hair on your scalp in patches. In some cases, you may lose all the hair on your scalp (alopecia totalis) or all the hair from your face and body (alopecia universalis). Alopecia areata is an autoimmune disease. This means that your body's defense system (immune system) mistakes normal parts of the body for germs or other things that can make you sick. When you have alopecia areata, the immune system attacks the hair follicles. Alopecia areata usually develops in childhood, but it can develop at any age. For some people, their hair grows back on its own and hair loss does not happen again. For others, their hair may fall out and grow back in cycles. The hair loss may last many years. Having this condition can be emotionally difficult, but it is not dangerous. What are the causes? The cause of this condition is not known. What increases the risk? This condition is more likely to develop in people who have:  A family history of alopecia.  A family history of another autoimmune disease, including type 1 diabetes and rheumatoid arthritis.  Asthma and allergies.  Down syndrome.  What are the signs or symptoms? Round spots of patchy hair loss on the scalp is the main symptom of this condition. The spots may be mildly itchy. Other symptoms include:  Short dark hairs in the bald patches that are wider at the top (exclamation point hairs).  Dents, white spots, or lines in the fingernails or toenails.  Balding and body hair loss. This is rare.  How is this diagnosed? This condition is diagnosed based on your symptoms and family history. Your health care provider will also check your scalp skin, teeth, and nails. Your health care provider may refer you to a specialist in hair and skin disorders (dermatologist). You may also have tests, including:  A hair pull  test.  Blood tests or other screening tests to check for autoimmune diseases, such as thyroid disease or diabetes.  Skin biopsy to confirm the diagnosis.  A procedure to examine the skin with a lighted magnifying instrument (dermoscopy).  How is this treated? There is no cure for alopecia areata. Treatment is aimed at promoting the regrowth of hair and preventing the immune system from overreacting. No single treatment is right for all people with alopecia areata. It depends on the type of hair loss you have and how severe it is. Work with your health care provider to find the best treatment for you. Treatment may include:  Having regular checkups to make sure the condition is not getting worse (watchful waiting).  Steroid creams or pills for 6-8 weeks to stop the immune reaction and help hair to regrow more quickly.  Other topical medicines to alter the immune system response and support the hair growth cycle.  Steroid injections.  Therapy and counseling with a support group or therapist if you are having trouble coping with hair loss.  Follow these instructions at home:  Learn as much as you can about your condition.  Apply topical creams only as told by your health care provider.  Take over-the-counter and prescription medicines only as told by your health care provider.  Consider getting a wig or products to make hair look fuller or to cover bald spots, if you feel uncomfortable with your appearance.  Get therapy or counseling if you are having a hard  time coping with hair loss. Ask your health care provider to recommend a counselor or support group.  Keep all follow-up visits as told by your health care provider. This is important. Contact a health care provider if:  Your hair loss gets worse, even with treatment.  You have new symptoms.  You are struggling emotionally. Summary  Alopecia areata is an autoimmune condition that makes your body's defense system (immune  system) attack the hair follicles. This causes you to lose hair.  Treatments may include regular checkups to make sure that the condition is not getting worse (watchful waiting), medicines, and steroid injections. This information is not intended to replace advice given to you by your health care provider. Make sure you discuss any questions you have with your health care provider. Document Released: 10/17/2003 Document Revised: 04/01/2016 Document Reviewed: 04/01/2016 Elsevier Interactive Patient Education  2018 Elsevier Inc. Td Vaccine (Tetanus and Diphtheria): What You Need to Know 1. Why get vaccinated? Tetanus  and diphtheria are very serious diseases. They are rare in the [place] today, but people who do become infected often have severe complications. Td vaccine is used to protect adolescents and adults from both of these diseases. Both tetanus and diphtheria are infections caused by bacteria. Diphtheria spreads from person to person through coughing or sneezing. Tetanus-causing bacteria enter the body through cuts, scratches, or wounds. TETANUS (lockjaw) causes painful muscle tightening and stiffness, usually all over the body.  It can lead to tightening of muscles in the head and neck so you can't open your mouth, swallow, or sometimes even breathe. Tetanus kills about 1 out of every 10 people who are infected even after receiving the best medical care.  DIPHTHERIA can cause a thick coating to form in the back of the throat.  It can lead to breathing problems, paralysis, heart failure, and death.  Before vaccines, as many as 200,000 cases of diphtheria and hundreds of cases of tetanus were reported in the [place] each year. Since vaccination began, reports of cases for both diseases have dropped by about 99%. 2. Td vaccine Td vaccine can protect adolescents and adults from tetanus and diphtheria. Td is usually given as a booster dose every 10 years but it can also be given  earlier after a severe and dirty wound or burn. Another vaccine, called Tdap, which protects against pertussis in addition to tetanus and diphtheria, is sometimes recommended instead of Td vaccine. Your doctor or the person giving you the vaccine can give you more information. Td may safely be given at the same time as other vaccines. 3. Some people should not get this vaccine  A person who has ever had a life-threatening allergic reaction after a previous dose of any tetanus or diphtheria containing vaccine, OR has a severe allergy to any part of this vaccine, should not get Td vaccine. Tell the person giving the vaccine about any severe allergies.  Talk to your doctor if you: ? had severe pain or swelling after any vaccine containing diphtheria or tetanus, ? ever had a condition called Guillain Barre Syndrome (GBS), ? aren't feeling well on the day the shot is scheduled. 4. What are the risks from Td vaccine? With any medicine, including vaccines, there is a chance of side effects. These are usually mild and go away on their own. Serious reactions are also possible but are rare. Most people who get Td vaccine do not have any problems with it. Mild problems following Td vaccine: (Did not interfere  with activities)  Pain where the shot was given (about 8 people in 10)  Redness or swelling where the shot was given (about 1 person in 4)  Mild fever (rare)  Headache (about 1 person in 4)  Tiredness (about 1 person in 4)  Moderate problems following Td vaccine: (Interfered with activities, but did not require medical attention)  Fever over 102F (rare)  Severe problems following Td vaccine: (Unable to perform usual activities; required medical attention)  Swelling, severe pain, bleeding and/or redness in the arm where the shot was given (rare).  Problems that could happen after any vaccine:  People sometimes faint after a medical procedure, including vaccination. Sitting or lying  down for about 15 minutes can help prevent fainting, and injuries caused by a fall. Tell your doctor if you feel dizzy, or have vision changes or ringing in the ears.  Some people get severe pain in the shoulder and have difficulty moving the arm where a shot was given. This happens very rarely.  Any medication can cause a severe allergic reaction. Such reactions from a vaccine are very rare, estimated at fewer than 1 in a million doses, and would happen within a few minutes to a few hours after the vaccination. As with any medicine, there is a very remote chance of a vaccine causing a serious injury or death. The safety of vaccines is always being monitored. For more information, visit: http://floyd.org/ 5. What if there is a serious reaction? What should I look for? Look for anything that concerns you, such as signs of a severe allergic reaction, very high fever, or unusual behavior. Signs of a severe allergic reaction can include hives, swelling of the face and throat, difficulty breathing, a fast heartbeat, dizziness, and weakness. These would usually start a few minutes to a few hours after the vaccination. What should I do?  If you think it is a severe allergic reaction or other emergency that can't wait, call 9-1-1 or get the person to the nearest hospital. Otherwise, call your doctor.  Afterward, the reaction should be reported to the Vaccine Adverse Event Reporting System (VAERS). Your doctor might file this report, or you can do it yourself through the VAERS web site at www.vaers.LAgents.no, or by calling 1-281-719-6437. ? VAERS does not give medical advice. 6. The National Vaccine Injury Compensation Program The [employer] Vaccine Injury Compensation Program (VICP) is a federal program that was created to compensate people who may have been injured by certain vaccines. Persons who believe they may have been injured by a vaccine can learn about the program and about filing a claim by  calling 1-(564)701-2622 or visiting the VICP website at SpiritualWord.at. There is a time limit to file a claim for compensation. 7. How can I learn more?  Ask your doctor. He or she can give you the vaccine package insert or suggest other sources of information.  Call your local or state health department.  Contact the Centers for Disease Control and Prevention (CDC): ? Call 364-346-8293 (1-800-CDC-INFO) ? Visit CDC's website at PicCapture.uy CDC Td Vaccine VIS (07/07/15) This information is not intended to replace advice given to you by your health care provider. Make sure you discuss any questions you have with your health care provider. Document Released: 01/09/2006 Document Revised: 12/03/2015 Document Reviewed: 12/03/2015 Elsevier Interactive Patient Education  2017 [employer].

## 2016-10-17 NOTE — Progress Notes (Signed)
Patient is here for establish care   Patient complains about hair loss dizziness & weak

## 2016-10-18 LAB — CBC WITH DIFFERENTIAL/PLATELET
BASOS ABS: 0 10*3/uL (ref 0.0–0.2)
Basos: 1 %
EOS (ABSOLUTE): 0.1 10*3/uL (ref 0.0–0.4)
Eos: 3 %
Hematocrit: 43.4 % (ref 37.5–51.0)
Hemoglobin: 14.7 g/dL (ref 13.0–17.7)
IMMATURE GRANULOCYTES: 0 %
Immature Grans (Abs): 0 10*3/uL (ref 0.0–0.1)
Lymphocytes Absolute: 2 10*3/uL (ref 0.7–3.1)
Lymphs: 34 %
MCH: 30.4 pg (ref 26.6–33.0)
MCHC: 33.9 g/dL (ref 31.5–35.7)
MCV: 90 fL (ref 79–97)
MONOS ABS: 0.5 10*3/uL (ref 0.1–0.9)
Monocytes: 8 %
NEUTROS PCT: 54 %
Neutrophils Absolute: 3.1 10*3/uL (ref 1.4–7.0)
PLATELETS: 235 10*3/uL (ref 150–379)
RBC: 4.83 x10E6/uL (ref 4.14–5.80)
RDW: 13.7 % (ref 12.3–15.4)
WBC: 5.7 10*3/uL (ref 3.4–10.8)

## 2016-10-18 LAB — HIV ANTIBODY (ROUTINE TESTING W REFLEX): HIV Screen 4th Generation wRfx: NONREACTIVE

## 2016-10-18 LAB — TSH: TSH: 2.46 u[IU]/mL (ref 0.450–4.500)

## 2016-10-18 LAB — VITAMIN D 25 HYDROXY (VIT D DEFICIENCY, FRACTURES): Vit D, 25-Hydroxy: 15.9 ng/mL — ABNORMAL LOW (ref 30.0–100.0)

## 2016-10-19 ENCOUNTER — Telehealth: Payer: Self-pay | Admitting: Family Medicine

## 2016-10-19 NOTE — Telephone Encounter (Signed)
Pt was last seen by [place]. I have no results for this pt.

## 2016-10-19 NOTE — Telephone Encounter (Signed)
Pt. Called requesting lab results. Please f/u with pt.  °

## 2016-10-20 ENCOUNTER — Other Ambulatory Visit: Payer: Self-pay | Admitting: Family Medicine

## 2016-10-20 ENCOUNTER — Telehealth: Payer: Self-pay

## 2016-10-20 DIAGNOSIS — E559 Vitamin D deficiency, unspecified: Secondary | ICD-10-CM

## 2016-10-20 MED ORDER — VITAMIN D (ERGOCALCIFEROL) 1.25 MG (50000 UNIT) PO CAPS: 50000.0000 [IU] | ORAL_CAPSULE | ORAL | 0 refills | Status: DC

## 2016-10-20 MED ORDER — VITAMIN D (ERGOCALCIFEROL) 1.25 MG (50000 UNIT) PO CAPS: 50000.0000 [IU] | ORAL_CAPSULE | ORAL | 0 refills | Status: AC

## 2016-10-20 MED FILL — VIT D2 1.25 MG (50,000 UNIT: 1.25 MG | 28 days supply | Qty: 4 | Fill #0

## 2016-10-20 NOTE — Telephone Encounter (Signed)
-----   Message from Lizbeth BarkMandesia R Hairston, FNP sent at 10/20/2016  9:07 AM EDT ----- HIV is negative.  Vitamin D level was low. Vitamin D helps to keep bones strong. You were prescribed ergocalciferol (capsules) to increase your vitamin-d level. Recommend follow up in 3 month to recheck.  Labs that evaluated your blood cells is normal. No signs of anemia, infection, or inflammation present. Thyroid function normal.

## 2016-10-20 NOTE — Telephone Encounter (Signed)
Patient came in office regarding lab results   Patient was aware and understood   Patient wanted his vitamins D change to our pharmacy   CMA change the medication to it

## 2016-10-20 NOTE — Telephone Encounter (Signed)
CMA call regarding lab results  Patient did not answer But left a Vm stating the reason of the call & to call back

## 2016-11-08 MED FILL — VIT D2 1.25 MG (50,000 UNIT: 1.25 MG | 28 days supply | Qty: 4 | Fill #1

## 2016-12-08 MED FILL — VIT D2 1.25 MG (50,000 UNIT: 1.25 MG | 28 days supply | Qty: 4 | Fill #2

## 2016-12-21 ENCOUNTER — Ambulatory Visit: Payer: Self-pay | Attending: Internal Medicine

## 2016-12-21 ENCOUNTER — Ambulatory Visit (HOSPITAL_BASED_OUTPATIENT_CLINIC_OR_DEPARTMENT_OTHER): Payer: Self-pay | Admitting: Pharmacist

## 2016-12-21 DIAGNOSIS — Z23 Encounter for immunization: Secondary | ICD-10-CM | POA: Insufficient documentation

## 2017-02-09 ENCOUNTER — Encounter: Payer: Self-pay | Admitting: Family Medicine

## 2017-02-09 ENCOUNTER — Ambulatory Visit: Payer: Self-pay | Attending: Family Medicine | Admitting: Family Medicine

## 2017-02-09 VITALS — BP 100/61 | HR 64 | Temp 98.4°F | Resp 18 | Ht 70.0 in | Wt 184.8 lb

## 2017-02-09 DIAGNOSIS — Z79899 Other long term (current) drug therapy: Secondary | ICD-10-CM | POA: Insufficient documentation

## 2017-02-09 DIAGNOSIS — L659 Nonscarring hair loss, unspecified: Secondary | ICD-10-CM | POA: Insufficient documentation

## 2017-02-09 DIAGNOSIS — E559 Vitamin D deficiency, unspecified: Secondary | ICD-10-CM | POA: Insufficient documentation

## 2017-02-09 DIAGNOSIS — Z09 Encounter for follow-up examination after completed treatment for conditions other than malignant neoplasm: Secondary | ICD-10-CM | POA: Insufficient documentation

## 2017-02-09 DIAGNOSIS — Z8639 Personal history of other endocrine, nutritional and metabolic disease: Secondary | ICD-10-CM

## 2017-02-09 NOTE — Progress Notes (Signed)
   Subjective:  Patient ID: Kyle Herrera, male    DOB: 03-25-1971  Age: 46 y.o. MRN: [id number]  CC: Follow-up   HPI Kyle Herrera presents for follow.  complains of hair loss a few months ago.  The hair loss is diffuse in distribution, with onset approximately 1 month ago.Patient denies scalp itch, scalp pain, scalp rash/ lesions and scalp scaling. Previous workup included vitamin d levels and patient was found to be deficient. He reports completing vitamin d supplements, with significant symptom improvement he states " my symptoms have 60% improved". He reports buying minoxidil over the counter but has not used. He declines referral to dermatology at this time.    Outpatient Medications Prior to Visit  Medication Sig Dispense Refill  . albuterol (PROVENTIL HFA;VENTOLIN HFA) 108 (90 BASE) MCG/ACT inhaler Inhale 1-2 puffs into the lungs every 6 (six) hours as needed for wheezing or shortness of breath. 8 g 3  . cetirizine (ZYRTEC) 10 MG tablet Take 1 tablet (10 mg total) by mouth daily. 30 tablet 5  . fluticasone (FLONASE) 50 MCG/ACT nasal spray Place 2 sprays into both nostrils daily. 16 g 6  . MINOXIDIL, TOPICAL, 5 % SOLN Apply 1 mL topically 2 (two) times daily. To scalp. 120 mL 2  . montelukast (SINGULAIR) 10 MG tablet Take 1 tablet (10 mg total) by mouth at bedtime. 30 tablet 3   No facility-administered medications prior to visit.     ROS Review of Systems  Constitutional: Negative.   Respiratory: Negative.   Cardiovascular: Negative.   Skin:       History of hair loss   Objective:  BP 100/61 (BP Location: Left Arm, Patient Position: Sitting, Cuff Size: Normal)   Pulse 64   Temp 98.4 F (36.9 C) (Oral)   Resp 18   Ht 5\' 10"  (1.778 m)   Wt 184 lb 12.8 oz (83.8 kg)   SpO2 96%   BMI 26.52 kg/m   BP/Weight 02/09/2017 10/17/2016 11/16/2015  Systolic BP 100 99 107  Diastolic BP 61 63 70  Wt. (Lbs) 184.8 180.8 188  BMI 26.52 25.94 27.76     Physical Exam    Constitutional: He appears well-developed and well-nourished.  HENT:  Head: Normocephalic and atraumatic.  Cardiovascular: Normal rate, regular rhythm, normal heart sounds and intact distal pulses.  Pulmonary/Chest: Effort normal and breath sounds normal.  Skin: Skin is warm and dry. No rash noted.  No scaling of the scalp seen, no localized areas of hair thinning.  Nursing note and vitals reviewed.   Assessment & Plan:   1. History of vitamin D deficiency Completed course, will re-evaluate levels - Vitamin D, 25-hydroxy  2. Follow up Recommend using minoxidil. - Vitamin D, 25-hydroxy     Follow-up: Return As needed.   Kyle BarkMandesia R Hairston FNP

## 2017-02-09 NOTE — Patient Instructions (Signed)
Vitamin D Deficiency Vitamin D deficiency is when your body does not have enough vitamin D. Vitamin D is important because:  It helps your body use other minerals that your body needs.  It helps keep your bones strong and healthy.  It may help to prevent some diseases.  It helps your heart and other muscles work well.  You can get vitamin D by:  Eating foods with vitamin D in them.  Drinking or eating milk or other foods that have had vitamin D added to them.  Taking a vitamin D supplement.  Being in the sun.  Not getting enough vitamin D can make your bones become soft. It can also cause other health problems. Follow these instructions at home:  Take medicines and supplements only as told by your doctor.  Eat foods that have vitamin D. These include: ? Dairy products, cereals, or juices with added vitamin D. Check the label for vitamin D. ? Fatty fish like salmon or trout. ? Eggs. ? Oysters.  Do not use tanning beds.  Stay at a healthy weight. Lose weight, if needed.  Keep all follow-up visits as told by your doctor. This is important. Contact a doctor if:  Your symptoms do not go away.  You feel sick to your stomach (nauseous).  Youthrow up (vomit).  You poop less often than usual or you have trouble pooping (constipation). This information is not intended to replace advice given to you by your health care provider. Make sure you discuss any questions you have with your health care provider. Document Released: 03/03/2011 Document Revised: 08/20/2015 Document Reviewed: 07/30/2014 Elsevier Interactive Patient Education  2018 Elsevier Inc.  

## 2017-02-09 NOTE — Progress Notes (Signed)
Patient is here for f/up   Patient been without med for 2 weeks

## 2017-02-10 LAB — VITAMIN D 25 HYDROXY (VIT D DEFICIENCY, FRACTURES): Vit D, 25-Hydroxy: 34.3 ng/mL (ref 30.0–100.0)

## 2017-02-14 ENCOUNTER — Telehealth: Payer: Self-pay | Admitting: *Deleted

## 2017-02-14 NOTE — Telephone Encounter (Signed)
Pt request results for labs: Vitamin-D level. He has been off medication for 2 weeks. Please advise

## 2017-02-15 ENCOUNTER — Telehealth: Payer: Self-pay

## 2017-02-15 NOTE — Telephone Encounter (Signed)
-----   Message from Lizbeth BarkMandesia R Hairston, [place] sent at 02/15/2017  9:36 AM EST ----- Vitamin d levels are normal. Recommend taking an over the counter vitamin d supplement with 2,000 IU to prevent deficiency.

## 2017-02-15 NOTE — Telephone Encounter (Signed)
CMA contact patient regarding lab results no answer but left a message

## 2017-02-15 NOTE — Telephone Encounter (Signed)
Results available. Vitamin d levels are normal. Recommend taking an over the counter vitamin d supplement with 2,000 IU to prevent deficiency.

## 2017-02-15 NOTE — Telephone Encounter (Signed)
Patient came in to office for results   Patient was aware and understood & receive a copy of results

## 2017-02-15 NOTE — Telephone Encounter (Signed)
CMA call regarding lab results   Patient did not answer but left a detailed message  & if have any questions just to call back   

## 2017-03-02 ENCOUNTER — Ambulatory Visit: Payer: Self-pay | Attending: Family Medicine

## 2017-03-02 ENCOUNTER — Ambulatory Visit: Payer: Self-pay

## 2017-04-21 ENCOUNTER — Emergency Department (HOSPITAL_COMMUNITY)
Admission: EM | Admit: 2017-04-21 | Discharge: 2017-04-22 | Disposition: A | Discharge status: Home / Self Care | Payer: Medicaid Other | Attending: Emergency Medicine | Admitting: Emergency Medicine

## 2017-04-21 ENCOUNTER — Other Ambulatory Visit: Payer: Self-pay

## 2017-04-21 ENCOUNTER — Encounter (HOSPITAL_COMMUNITY): Payer: Self-pay

## 2017-04-21 ENCOUNTER — Telehealth: Payer: Self-pay | Admitting: Family Medicine

## 2017-04-21 DIAGNOSIS — Z79899 Other long term (current) drug therapy: Secondary | ICD-10-CM | POA: Insufficient documentation

## 2017-04-21 DIAGNOSIS — R1013 Epigastric pain: Secondary | ICD-10-CM | POA: Insufficient documentation

## 2017-04-21 LAB — COMPREHENSIVE METABOLIC PANEL
ALT: 17 U/L (ref 17–63)
AST: 21 U/L (ref 15–41)
Albumin: 3.9 g/dL (ref 3.5–5.0)
Alkaline Phosphatase: 66 U/L (ref 38–126)
Anion gap: 11 (ref 5–15)
BUN: 12 mg/dL (ref 6–20)
CO2: 21 mmol/L — ABNORMAL LOW (ref 22–32)
Calcium: 8.7 mg/dL — ABNORMAL LOW (ref 8.9–10.3)
Chloride: 102 mmol/L (ref 101–111)
Creatinine, Ser: 0.94 mg/dL (ref 0.61–1.24)
GFR calc Af Amer: >60 mL/min (ref >60)
GFR calc non Af Amer: >60 mL/min (ref >60)
Glucose, Bld: 103 mg/dL — ABNORMAL HIGH (ref 65–99)
Potassium: 3.5 mmol/L (ref 3.5–5.1)
Sodium: 134 mmol/L — ABNORMAL LOW (ref 135–145)
Total Bilirubin: 0.4 mg/dL (ref 0.3–1.2)
Total Protein: 6.5 g/dL (ref 6.5–8.1)

## 2017-04-21 LAB — CBC
HCT: 41.6 % (ref 39.0–52.0)
Hemoglobin: 14.3 g/dL (ref 13.0–17.0)
MCH: 30.9 pg (ref 26.0–34.0)
MCHC: 34.4 g/dL (ref 30.0–36.0)
MCV: 89.8 fL (ref 78.0–100.0)
Platelets: 163 10*3/uL (ref 150–400)
RBC: 4.63 MIL/uL (ref 4.22–5.81)
RDW: 12.3 % (ref 11.5–15.5)
WBC: 6.3 10*3/uL (ref 4.0–10.5)

## 2017-04-21 LAB — LIPASE, BLOOD: Lipase: 33 U/L (ref 11–51)

## 2017-04-21 LAB — I-STAT CG4 LACTIC ACID, ED: Lactic Acid, Venous: 0.96 mmol/L (ref 0.5–1.9)

## 2017-04-21 MED ORDER — SUCRALFATE 1 G PO TABS: 1.0000 g | ORAL_TABLET | Freq: Three times a day (TID) | ORAL | 0 refills | Status: DC

## 2017-04-21 MED ORDER — OMEPRAZOLE 20 MG PO CPDR: 20.0000 mg | DELAYED_RELEASE_CAPSULE | Freq: Every day | ORAL | 0 refills | Status: DC

## 2017-04-21 MED ORDER — ALBUTEROL SULFATE HFA 108 (90 BASE) MCG/ACT IN AERS: 2.0000 | INHALATION_SPRAY | RESPIRATORY_TRACT | 3 refills | Status: DC | PRN

## 2017-04-21 MED ORDER — GI COCKTAIL ~~LOC~~
30.0000 mL | Freq: Once | ORAL | Status: AC
  Administered 2017-04-22: 30 mL via ORAL
  Filled 2017-04-21: qty 30

## 2017-04-21 NOTE — ED Triage Notes (Signed)
Pt endorses abd pain since last night. Denies n/v/d.

## 2017-04-21 NOTE — ED Notes (Signed)
Pt remains in waiting room. Updated on wait for treatment room. 

## 2017-04-21 NOTE — ED Provider Notes (Signed)
[facility] Note   CSN: [id number] Arrival date & time: 04/21/17  1548     History   Chief Complaint Chief Complaint  Patient presents with  . Abdominal Pain    HPI Cephus SlaterSabah Faraj Ahmed is a 47 y.o. male.  Patient presents to the emergency department with a chief complaint of epigastric abdominal pain.  He reports having burning in his epigastrium that started this morning.  He also reports some burning with swallowing.  He has never experienced this before.  He denies any nausea, vomiting, or diarrhea.  He states that he drinks coffee daily.  He does not smoke.  He does occasionally eat spicy foods.  He has not taken anything for symptoms.  Additionally, he reports that his wife is been sick with the flu.   The history is provided by the patient. No language interpreter was used.    History reviewed. No pertinent past medical history.  Patient Active Problem List   Diagnosis Date Noted  . Alopecia areata 11/16/2015  . Environmental allergies 01/22/2015    History reviewed. No pertinent surgical history.     Home Medications    Prior to Admission medications   Medication Sig Start Date End Date Taking? Authorizing Provider  albuterol (PROVENTIL HFA;VENTOLIN HFA) 108 (90 BASE) MCG/ACT inhaler Inhale 1-2 puffs into the lungs every 6 (six) hours as needed for wheezing or shortness of breath. 01/22/15   Funches, Gerilyn NestleJosalyn, MD  cetirizine (ZYRTEC) 10 MG tablet Take 1 tablet (10 mg total) by mouth daily. 01/22/15   Funches, Gerilyn NestleJosalyn, MD  fluticasone (FLONASE) 50 MCG/ACT nasal spray Place 2 sprays into both nostrils daily. 01/22/15   Funches, Gerilyn NestleJosalyn, MD  MINOXIDIL, TOPICAL, 5 % SOLN Apply 1 mL topically 2 (two) times daily. To scalp. 10/17/16   Lizbeth BarkHairston, Mandesia R, FNP  montelukast (SINGULAIR) 10 MG tablet Take 1 tablet (10 mg total) by mouth at bedtime. 01/22/15   Dessa PhiFunches, Josalyn, MD    Family History History reviewed. No  pertinent family history.  Social History Social History   Tobacco Use  . Smoking status: Never Smoker  . Smokeless tobacco: Never Used  Substance Use Topics  . Alcohol use: No  . Drug use: No     Allergies   Patient has no known allergies.   Review of Systems Review of Systems  All other systems reviewed and are negative.    Physical Exam Updated Vital Signs BP 129/64 (BP Location: Right Arm)   Pulse 73   Temp 99 F (37.2 C) (Oral)   Resp 18   Ht 5\' 9"  (1.753 m)   Wt 83.5 kg (184 lb)   SpO2 99%   BMI 27.17 kg/m   Physical Exam  Constitutional: He is oriented to person, place, and time. He appears well-developed and well-nourished.  HENT:  Head: Normocephalic and atraumatic.  Eyes: Conjunctivae and EOM are normal. Pupils are equal, round, and reactive to light. Right eye exhibits no discharge. Left eye exhibits no discharge. No scleral icterus.  Neck: Normal range of motion. Neck supple. No JVD present.  Cardiovascular: Normal rate, regular rhythm and normal heart sounds. Exam reveals no gallop and no friction rub.  No murmur heard. Pulmonary/Chest: Effort normal and breath sounds normal. No respiratory distress. He has no wheezes. He has no rales. He exhibits no tenderness.  Abdominal: Soft. He exhibits no distension and no mass. There is no tenderness. There is no rebound and no guarding.  No focal abdominal  tenderness, no RLQ tenderness or pain at McBurney's point, no RUQ tenderness or Murphy's sign, no left-sided abdominal tenderness, no fluid wave, or signs of peritonitis   Musculoskeletal: Normal range of motion. He exhibits no edema or tenderness.  Neurological: He is alert and oriented to person, place, and time.  Skin: Skin is warm and dry.  Psychiatric: He has a normal mood and affect. His behavior is normal. Judgment and thought content normal.  Nursing note and vitals reviewed.    ED Treatments / Results  Labs (all labs ordered are listed, but  only abnormal results are displayed) Labs Reviewed  COMPREHENSIVE METABOLIC PANEL - Abnormal; Notable for the following components:      Result Value   Sodium 134 (*)    CO2 21 (*)    Glucose, Bld 103 (*)    Calcium 8.7 (*)    All other components within normal limits  LIPASE, BLOOD  CBC  I-STAT CG4 LACTIC ACID, ED    EKG  EKG Interpretation None       Radiology No results found.  Procedures Procedures (including critical care time)  Medications Ordered in ED Medications  gi cocktail (Maalox,Lidocaine,Donnatal) (not administered)     Initial Impression / Assessment and Plan / ED Course  I have reviewed the triage vital signs and the nursing notes.  Pertinent labs & imaging results that were available during my care of the patient were reviewed by me and considered in my medical decision making (see chart for details).     With epigastric abdominal pain.  He has no focal tenderness on exam.  Vital signs are stable.  No leukocytosis.  Normal lactic acid.  Lipase is normal.  Electrolytes are unremarkable.  We will treat with GI cocktail.  His symptoms sound very consistent with GERD versus peptic ulcer disease.  We will also discharged home with omeprazole and Carafate.  Additionally, patient asked for an inhaler refill.  Final Clinical Impressions(s) / ED Diagnoses   Final diagnoses:  Epigastric pain    ED Discharge Orders        Ordered    albuterol (PROVENTIL HFA;VENTOLIN HFA) 108 (90 Base) MCG/ACT inhaler  Every 4 hours PRN     04/21/17 2336    omeprazole (PRILOSEC) 20 MG capsule  Daily     04/21/17 2336    sucralfate (CARAFATE) 1 g tablet  3 times daily with meals & bedtime     04/21/17 2336       Roxy Horseman, PA-C 04/21/17 2336    Raeford Razor, MD 04/21/17 2358

## 2017-04-21 NOTE — Telephone Encounter (Signed)
Patient called stating he had an unbarable pain in his stomach and asked what to do.Patient was referred to urgent care. Patient then stated he was unable to get there and to also move. Patient was then referred to call ambulance for help.

## 2017-04-24 MED FILL — !VENTOLIN HFA INHALER: 108 (90 BAS | 16 days supply | Qty: 18 | Fill #0

## 2017-04-24 MED FILL — SUCRALFATE 1 GM TABLET: 1 | 25 days supply | Qty: 100 | Fill #0

## 2017-04-26 MED FILL — !PROAIR HFA 90 MCG INHALER: 108 (90 BAS | 25 days supply | Qty: 1 | Fill #1

## 2017-05-11 ENCOUNTER — Ambulatory Visit: Payer: Self-pay | Attending: Family Medicine | Admitting: Family Medicine

## 2017-05-11 ENCOUNTER — Encounter: Payer: Self-pay | Admitting: Family Medicine

## 2017-05-11 VITALS — BP 91/57 | HR 72 | Temp 98.3°F | Resp 16 | Ht 70.0 in | Wt 179.8 lb

## 2017-05-11 DIAGNOSIS — Z79899 Other long term (current) drug therapy: Secondary | ICD-10-CM | POA: Insufficient documentation

## 2017-05-11 DIAGNOSIS — L659 Nonscarring hair loss, unspecified: Secondary | ICD-10-CM | POA: Insufficient documentation

## 2017-05-11 DIAGNOSIS — R1013 Epigastric pain: Secondary | ICD-10-CM | POA: Insufficient documentation

## 2017-05-11 DIAGNOSIS — K219 Gastro-esophageal reflux disease without esophagitis: Secondary | ICD-10-CM | POA: Insufficient documentation

## 2017-05-11 MED ORDER — OMEPRAZOLE 20 MG PO CPDR: 20.0000 mg | DELAYED_RELEASE_CAPSULE | Freq: Two times a day (BID) | ORAL | 2 refills | Status: DC

## 2017-05-11 MED FILL — ?OMEPRAZOLE 20 MG CPDR: 20 | 30 days supply | Qty: 60 | Fill #0

## 2017-05-11 NOTE — Progress Notes (Signed)
Patient is here for his stomach pain and stated he can't eat well. Patient stated when he eats it feels like it's beating.  Patient would like to do blood work for Iron and say the OTC supplement Vitamin D is not helping and he have hair loss again.

## 2017-05-11 NOTE — Progress Notes (Signed)
Subjective:  Patient ID: Kyle Herrera, male    DOB: 24-Dec-1970  Age: 47 y.o. MRN: [id number]  CC: Abdominal Pain   HPI Kyle Herrera presents for complaint of abdominal pain. He reports history of ED visit for similar symptoms. In the ED he was evaluated for c/o epigastric pain and burning sensation with swallowing. He denied any N/V/D. He reports drinking coffee daily. Workup unremarkable. GI cocktail given. He was treated for GERD w/ possible PUD and was discharged with PPI and Carafate. He present today with epigastric pain with cramps. He denies any fevers, N/V, or melena.  Associated symptoms include throat irritation. He reports adherence with medications, but reports symptoms aren't completely relieved. History of hair loss a few months ago. He reports symptoms improved with prescription vitamin d supplement, but have returned since finishing vitamin d supplement. He is requesting lab work to rule out anemia and check for vitamin d deficiency. Patient denies scalp itch, scalp pain, scalp rash/ lesions and scalp scaling.  Outpatient Medications Prior to Visit  Medication Sig Dispense Refill  . sucralfate (CARAFATE) 1 g tablet Take 1 tablet (1 g total) by mouth 4 (four) times daily -  with meals and at bedtime. 120 tablet 0  . omeprazole (PRILOSEC) 20 MG capsule Take 1 capsule (20 mg total) by mouth daily. 30 capsule 0  . albuterol (PROVENTIL HFA;VENTOLIN HFA) 108 (90 BASE) MCG/ACT inhaler Inhale 1-2 puffs into the lungs every 6 (six) hours as needed for wheezing or shortness of breath. (Patient not taking: Reported on 05/11/2017) 8 g 3  . albuterol (PROVENTIL HFA;VENTOLIN HFA) 108 (90 Base) MCG/ACT inhaler Inhale 2 puffs into the lungs every 4 (four) hours as needed for wheezing or shortness of breath. (Patient not taking: Reported on 05/11/2017) 1 Inhaler 3  . cetirizine (ZYRTEC) 10 MG tablet Take 1 tablet (10 mg total) by mouth daily. (Patient not taking: Reported on 05/11/2017) 30  tablet 5  . fluticasone (FLONASE) 50 MCG/ACT nasal spray Place 2 sprays into both nostrils daily. (Patient not taking: Reported on 05/11/2017) 16 g 6  . MINOXIDIL, TOPICAL, 5 % SOLN Apply 1 mL topically 2 (two) times daily. To scalp. (Patient not taking: Reported on 05/11/2017) 120 mL 2  . montelukast (SINGULAIR) 10 MG tablet Take 1 tablet (10 mg total) by mouth at bedtime. (Patient not taking: Reported on 05/11/2017) 30 tablet 3   No facility-administered medications prior to visit.     ROS Review of Systems  Constitutional: Negative.   HENT: Positive for trouble swallowing.   Respiratory: Negative.   Cardiovascular: Negative.   Gastrointestinal: Positive for abdominal pain.  Skin:       Hair loss    Objective:  BP (!) 91/57 (BP Location: Left Arm, Patient Position: Sitting, Cuff Size: Normal)   Pulse 72   Temp 98.3 F (36.8 C) (Oral)   Resp 16   Ht 5\' 10"  (1.778 m)   Wt 179 lb 12.8 oz (81.6 kg)   SpO2 96%   BMI 25.80 kg/m   BP/Weight 05/11/2017 04/21/2017 02/09/2017  Systolic BP 91 114 100  Diastolic BP 57 74 61  Wt. (Lbs) 179.8 184 184.8  BMI 25.8 27.17 26.52     Physical Exam  Constitutional: He appears well-developed and well-nourished.  HENT:  Head: Normocephalic and atraumatic.  Right Ear: External ear normal.  Left Ear: External ear normal.  Nose: Nose normal.  Mouth/Throat: Oropharynx is clear and moist.  Eyes: Conjunctivae are normal. Pupils are equal,  round, and reactive to light.  Neck: No JVD present.  Cardiovascular: Normal rate, regular rhythm, normal heart sounds and intact distal pulses.  Pulmonary/Chest: Effort normal and breath sounds normal.  Abdominal: Soft. Bowel sounds are normal. There is tenderness.  Skin: Skin is warm and dry.  No scaling of the scalp seen, no localized areas of hair thinning.  Nursing note and vitals reviewed.    Assessment & Plan:   1. Epigastric pain  - H. pylori breath test - Ambulatory referral to  Gastroenterology - CBC - Vitamin D, 25-hydroxy - CMP and Liver  2. Gastroesophageal reflux disease, esophagitis presence not specified Inc PPI.  - omeprazole (PRILOSEC) 20 MG capsule; Take 1 capsule (20 mg total) by mouth 2 (two) times daily before a meal.  Dispense: 60 capsule; Refill: 2  3. Hair loss  - CBC - Vitamin D, 25-hydroxy - Ambulatory referral to Dermatology       Follow-up: Return if symptoms worsen or fail to improve.   Lizbeth Bark FNP

## 2017-05-11 NOTE — Patient Instructions (Signed)
Food Choices for Gastroesophageal Reflux Disease, Adult When you have gastroesophageal reflux disease (GERD), the foods you eat and your eating habits are very important. Choosing the right foods can help ease your discomfort. What guidelines do I need to follow?  Choose fruits, vegetables, whole grains, and low-fat dairy products.  Choose low-fat meat, fish, and poultry.  Limit fats such as oils, salad dressings, butter, nuts, and avocado.  Keep a food diary. This helps you identify foods that cause symptoms.  Avoid foods that cause symptoms. These may be different for everyone.  Eat small meals often instead of 3 large meals a day.  Eat your meals slowly, in a place where you are relaxed.  Limit fried foods.  Cook foods using methods other than frying.  Avoid drinking alcohol.  Avoid drinking large amounts of liquids with your meals.  Avoid bending over or lying down until 2-3 hours after eating. What foods are not recommended? These are some foods and drinks that may make your symptoms worse: Vegetables  Tomatoes. Tomato juice. Tomato and spaghetti sauce. Chili peppers. Onion and garlic. Horseradish. Fruits  Oranges, grapefruit, and lemon (fruit and juice). Meats  High-fat meats, fish, and poultry. This includes hot dogs, ribs, ham, sausage, salami, and bacon. Dairy  Whole milk and chocolate milk. Sour cream. Cream. Butter. Ice cream. Cream cheese. Drinks  Coffee and tea. Bubbly (carbonated) drinks or energy drinks. Condiments  Hot sauce. Barbecue sauce. Sweets/Desserts  Chocolate and cocoa. Donuts. Peppermint and spearmint. Fats and Oils  High-fat foods. This includes French fries and potato chips. Other  Vinegar. Strong spices. This includes black pepper, white pepper, red pepper, cayenne, curry powder, cloves, ginger, and chili powder. The items listed above may not be a complete list of foods and drinks to avoid. Contact your dietitian for more information.    This information is not intended to replace advice given to you by your health care provider. Make sure you discuss any questions you have with your health care provider. Document Released: 09/13/2011 Document Revised: 08/20/2015 Document Reviewed: 01/16/2013 Elsevier Interactive Patient Education  2017 Elsevier Inc.  

## 2017-05-12 LAB — CBC
HEMATOCRIT: 40.4 % (ref 37.5–51.0)
HEMOGLOBIN: 14.2 g/dL (ref 13.0–17.7)
MCH: 30.7 pg (ref 26.6–33.0)
MCHC: 35.1 g/dL (ref 31.5–35.7)
MCV: 87 fL (ref 79–97)
Platelets: 287 10*3/uL (ref 150–379)
RBC: 4.62 x10E6/uL (ref 4.14–5.80)
RDW: 13.8 % (ref 12.3–15.4)
WBC: 5.7 10*3/uL (ref 3.4–10.8)

## 2017-05-12 LAB — CMP AND LIVER
ALBUMIN: 4.2 g/dL (ref 3.5–5.5)
ALK PHOS: 64 IU/L (ref 39–117)
ALT: 18 IU/L (ref 0–44)
AST: 13 IU/L (ref 0–40)
BILIRUBIN TOTAL: 0.3 mg/dL (ref 0.0–1.2)
BILIRUBIN, DIRECT: 0.1 mg/dL (ref 0.00–0.40)
BUN: 13 mg/dL (ref 6–24)
CHLORIDE: 102 mmol/L (ref 96–106)
CO2: 24 mmol/L (ref 20–29)
Calcium: 9.5 mg/dL (ref 8.7–10.2)
Creatinine, Ser: 0.82 mg/dL (ref 0.76–1.27)
GFR calc Af Amer: 123 mL/min/{1.73_m2} (ref >59)
GFR calc non Af Amer: 106 mL/min/{1.73_m2} (ref >59)
GLUCOSE: 74 mg/dL (ref 65–99)
POTASSIUM: 4.5 mmol/L (ref 3.5–5.2)
Sodium: 141 mmol/L (ref 134–144)
Total Protein: 6.9 g/dL (ref 6.0–8.5)

## 2017-05-12 LAB — VITAMIN D 25 HYDROXY (VIT D DEFICIENCY, FRACTURES): Vit D, 25-Hydroxy: 41.8 ng/mL (ref 30.0–100.0)

## 2017-05-14 LAB — H. PYLORI BREATH TEST: H pylori Breath Test: POSITIVE — AB

## 2017-05-16 ENCOUNTER — Telehealth: Payer: Self-pay | Admitting: Family Medicine

## 2017-05-16 ENCOUNTER — Other Ambulatory Visit: Payer: Self-pay | Admitting: Family Medicine

## 2017-05-16 DIAGNOSIS — A048 Other specified bacterial intestinal infections: Secondary | ICD-10-CM

## 2017-05-16 MED ORDER — CLARITHROMYCIN 500 MG PO TABS
500.0000 mg | ORAL_TABLET | Freq: Two times a day (BID) | ORAL | 0 refills | Status: DC
Start: 2017-05-16 — End: 2017-06-12

## 2017-05-16 MED ORDER — AMOXICILLIN 500 MG PO TABS: 1000.0000 mg | ORAL_TABLET | Freq: Two times a day (BID) | ORAL | 0 refills | Status: DC

## 2017-05-16 MED FILL — AMOXICILLIN 500 MG CAPSULE: 500 | 14 days supply | Qty: 56 | Fill #0

## 2017-05-16 MED FILL — ?CLARITHROMYCIN 500 MG TAB: 500 | 14 days supply | Qty: 28 | Fill #0

## 2017-05-16 NOTE — Telephone Encounter (Signed)
Patient stopped by the office and expressed concerns regarding Vitamin D OTC dosage. PT stated when he was prescribed 2,000 ui before and the vitamin D was resolved. He wanted to make sure you ment 800 ui instead of the 2,[facility].

## 2017-05-16 NOTE — Telephone Encounter (Signed)
Please fu with patient regarding lab results, at your earliest convenience. Patient called to get results no note was put in.

## 2017-05-16 NOTE — Telephone Encounter (Signed)
Labs resulted. Please call patient.

## 2017-05-16 NOTE — Telephone Encounter (Signed)
Patient was informed of lab results

## 2017-05-17 ENCOUNTER — Ambulatory Visit (INDEPENDENT_AMBULATORY_CARE_PROVIDER_SITE_OTHER): Payer: Medicaid Other | Admitting: Gastroenterology

## 2017-05-17 ENCOUNTER — Encounter: Payer: Self-pay | Admitting: Gastroenterology

## 2017-05-17 VITALS — BP 96/62 | HR 80 | Ht 69.0 in | Wt 183.0 lb

## 2017-05-17 DIAGNOSIS — R1013 Epigastric pain: Secondary | ICD-10-CM

## 2017-05-17 DIAGNOSIS — A048 Other specified bacterial intestinal infections: Secondary | ICD-10-CM

## 2017-05-17 MED ORDER — METRONIDAZOLE 500 MG PO TABS: 500.0000 mg | ORAL_TABLET | Freq: Two times a day (BID) | ORAL | 0 refills | Status: DC

## 2017-05-17 MED FILL — ?METRONIDAZOLE 500MG TABS: 500 | 14 days supply | Qty: 28 | Fill #0

## 2017-05-17 NOTE — Patient Instructions (Addendum)
If you are age 47 or older, your body mass index should be between 23-30. Your Body mass index is 27.02 kg/m. If this is out of the aforementioned range listed, please consider follow up with your Primary Care Provider.  If you are age 47 or younger, your body mass index should be between 19-25. Your Body mass index is 27.02 kg/m. If this is out of the aformentioned range listed, please consider follow up with your Primary Care Provider.   Please continue taking amoxicillin, clarithromycin and omeprazole.  We have sent the following medications to your pharmacy for you to pick up at your convenience: Flagyl 500mg . Take twice a day for 2 weeks.  DO NOT bring alcohol while on this medication.    Please call our office in 2 weeks and let us know how you are doing. We will determine at that time how to proceed with your Endoscopy.  Thank you for entrusting me with your care and [facility], Dr. Ileene PatrickSteven Armbruster

## 2017-05-17 NOTE — Progress Notes (Signed)
HPI :  47 year old male with no significant medical history, referred here by Lizbeth BarkHairston, Mandesia R, F for new patient visit for epigastric pain.  He states he had the flu about 3 weeks ago, during this time he developed symptoms of comfort in his epigastric area.  He initially was sent to the ER and given a GI cocktail which relieved his symptoms.  He states his symptoms have continued to bother him over time.  He endorses a heaviness/pressure in his epigastric area after he eats.  His discomfort does not radiate anywhere.  This happens intermittently and does not occur every time he eats.  He had a H pylori breath test positive on 05/11/17 - given amoxicillin, clarithromycin, PPI -he just started taking this yesterday.  He denies any nausea or vomiting.  He denies any pyrosis or dysphagia.  No weight loss.  He denies any bowel habit changes.  He denies any family history of colon/gastric/esophageal cancer.  He endorses rare ibuprofen use but not routinely.  He is never had a prior EGD.  He denies any tobacco use.  Denies any alcohol use.  Basic labs show no evidence of anemia, normal LFTs.  Past Medical History:  Diagnosis Date  . H. pylori infection      No past surgical history on file. No family history on file. Social History   Tobacco Use  . Smoking status: Never Smoker  . Smokeless tobacco: Never Used  Substance Use Topics  . Alcohol use: No  . Drug use: No   Current Outpatient Medications  Medication Sig Dispense Refill  . albuterol (PROVENTIL HFA;VENTOLIN HFA) 108 (90 BASE) MCG/ACT inhaler Inhale 1-2 puffs into the lungs every 6 (six) hours as needed for wheezing or shortness of breath. 8 g 3  . amoxicillin (AMOXIL) 500 MG tablet Take 2 tablets (1,000 mg total) by mouth 2 (two) times daily. 56 tablet 0  . cetirizine (ZYRTEC) 10 MG tablet Take 1 tablet (10 mg total) by mouth daily. 30 tablet 5  . clarithromycin (BIAXIN) 500 MG tablet Take 1 tablet (500 mg total) by mouth 2 (two)  times daily. 28 tablet 0  . fluticasone (FLONASE) 50 MCG/ACT nasal spray Place 2 sprays into both nostrils daily. 16 g 6  . MINOXIDIL, TOPICAL, 5 % SOLN Apply 1 mL topically 2 (two) times daily. To scalp. 120 mL 2  . montelukast (SINGULAIR) 10 MG tablet Take 1 tablet (10 mg total) by mouth at bedtime. 30 tablet 3  . omeprazole (PRILOSEC) 20 MG capsule Take 1 capsule (20 mg total) by mouth 2 (two) times daily before a meal. 60 capsule 2  . sucralfate (CARAFATE) 1 g tablet Take 1 tablet (1 g total) by mouth 4 (four) times daily -  with meals and at bedtime. 120 tablet 0  . metroNIDAZOLE (FLAGYL) 500 MG tablet Take 1 tablet (500 mg total) by mouth 2 (two) times daily. 28 tablet 0   No current facility-administered medications for this visit.    No Known Allergies   Review of Systems: All systems reviewed and negative except where noted in HPI.   Lab Results  Component Value Date   WBC 5.7 05/11/2017   HGB 14.2 05/11/2017   HCT 40.4 05/11/2017   MCV 87 05/11/2017   PLT 287 05/11/2017    Lab Results  Component Value Date   CREATININE 0.82 05/11/2017   BUN 13 05/11/2017   NA 141 05/11/2017   K 4.5 05/11/2017   CL 102 05/11/2017  CO2 24 05/11/2017    Lab Results  Component Value Date   ALT 18 05/11/2017   AST 13 05/11/2017   ALKPHOS 64 05/11/2017   BILITOT 0.3 05/11/2017     Physical Exam: BP 96/62   Pulse 80   Ht 5\' 9"  (1.753 m)   Wt 183 lb (83 kg)   BMI 27.02 kg/m  Constitutional: Pleasant,well-developed, male in no acute distress. HEENT: Normocephalic and atraumatic. Conjunctivae are normal. No scleral icterus. Neck supple.  Cardiovascular: Normal rate, regular rhythm.  Pulmonary/chest: Effort normal and breath sounds normal. No wheezing, rales or rhonchi. Abdominal: Soft, nondistended, nontender. There are no masses palpable. No hepatomegaly. Extremities: no edema Lymphadenopathy: No cervical adenopathy noted. Neurological: Alert and oriented to person place  and time. Skin: Skin is warm and dry. No rashes noted. Psychiatric: Normal mood and affect. Behavior is normal.   ASSESSMENT AND PLAN: 47 year old male with new onset postprandial epigastric recent weeks.  He tested positive for H. pylori and has just started taking the regimen as outlined above.  It is very possible he has H. pylori/PUD causing his symptoms, while biliary colic also possible.  Recommend he complete 2 weeks of therapy for H. pylori and then assess response to this course.  I recommend adding Flagyl to amoxicillin and clarithromycin, and PPI, to improve chances for eradication.  Recommend the following: - amoxicillin 1gm BID - clarithromycin 500mg  BID - flagyl 500mg  BID - omeprazole 20mg  BID  - antibiotic regimen for 14 days - I asked him to call us once done with therapy and see how he feels. If symptoms persist despite therapy may consider EGD and US of the RUQ.  - If symptoms resolve following therapy, would recommend H pylori eradication testing in roughly 6 weeks, while off PPI, to ensure eradication - the patient should avoid alcohol while taking Flagyl. I counseled him this antibiotic regimen can cause nausea / GI upset / diarrhea  He agreed with the plan.   Ileene Patrick, MD [facility] Pager [phone]  CC: Thomas Hoff*

## 2017-05-18 ENCOUNTER — Telehealth: Payer: Self-pay | Admitting: Gastroenterology

## 2017-05-18 NOTE — Telephone Encounter (Signed)
You can tell him this infection is fairly common amongst people who have symptoms like he has had. However we don't routinely test family members for this, unless they have symptoms present. If they are having similar symptoms they should seek evaluation and be tested. Thanks

## 2017-05-18 NOTE — Telephone Encounter (Signed)
Patient forgot to ask yesterday, since he has H pylori infection is this concerning that his wife/children may have this too?

## 2017-05-18 NOTE — Telephone Encounter (Signed)
Left message for patient re: if family has symptoms then they should be evaluated and tested.

## 2017-05-19 NOTE — Telephone Encounter (Signed)
Called patient, no answer, lvm to return call.  °

## 2017-05-19 NOTE — Telephone Encounter (Signed)
Patient was informed.

## 2017-05-19 NOTE — Telephone Encounter (Signed)
If he is taking 2,000 IU for history of  vitamin d deficiency over the counter that is fine.

## 2017-05-22 ENCOUNTER — Telehealth: Payer: Self-pay | Admitting: *Deleted

## 2017-05-22 NOTE — Telephone Encounter (Signed)
Left message on voicemail for patient to return call.   Lizbeth BarkHairston, Mandesia R, FNP  Guy FrancoBenjamin, Kiasia Chou, RN        -H.pylori is positive. H.pylori is a bacteria that can infect the stomach and cause stomach ulcers. You will be prescribed a course of antibiotics continue to take omeprazole twice daily during course of treatment.  Labs negative for anemia.  Vitamin d levels are normal. To prevent deficiency recommend OTC vitamin d supplement with 800 IU.

## 2017-05-25 NOTE — Telephone Encounter (Signed)
Pt informed of results by Esaw DaceHalle Clodfelter, RMA.

## 2017-05-31 ENCOUNTER — Ambulatory Visit: Payer: Medicaid Other

## 2017-06-02 ENCOUNTER — Telehealth: Payer: Self-pay | Admitting: Gastroenterology

## 2017-06-02 ENCOUNTER — Ambulatory Visit: Payer: Self-pay | Attending: Internal Medicine

## 2017-06-05 ENCOUNTER — Other Ambulatory Visit: Payer: Self-pay

## 2017-06-05 NOTE — Telephone Encounter (Signed)
Left message that I was returning his call.

## 2017-06-05 NOTE — Telephone Encounter (Signed)
Patient finished H Pylori medication approximately 4 days ago, he is still having occasional epigastric pain. Taking omeprazole BID.

## 2017-06-05 NOTE — Telephone Encounter (Signed)
Left message for patient to call back if he wishes to proceed with EGD. Patient works 3rd shift, so best time to reach him is late afternoon.

## 2017-06-05 NOTE — Telephone Encounter (Signed)
Sorry to hear he is not feeling better. I think an EGD at this point is the next step. We can ensure no upper tract process and check for H pylori eradication. If he is comfortable proceeding with this can you help him schedule. May also need an US to look for gallstones pending that result.

## 2017-06-05 NOTE — Telephone Encounter (Signed)
Patient calling back. Not quite sure what he was needing.

## 2017-06-06 NOTE — Telephone Encounter (Signed)
Spoke to patient and scheduled for PV and EGD.

## 2017-06-12 ENCOUNTER — Other Ambulatory Visit: Payer: Self-pay

## 2017-06-12 ENCOUNTER — Ambulatory Visit (AMBULATORY_SURGERY_CENTER): Payer: Self-pay | Admitting: *Deleted

## 2017-06-12 VITALS — Ht 69.0 in | Wt 174.0 lb

## 2017-06-12 DIAGNOSIS — R1013 Epigastric pain: Secondary | ICD-10-CM

## 2017-06-12 DIAGNOSIS — A048 Other specified bacterial intestinal infections: Secondary | ICD-10-CM

## 2017-06-12 NOTE — Progress Notes (Signed)
Patient denies any allergies to eggs or soy. Patient denies any problems with anesthesia/sedation. Patient denies any oxygen use at home. Patient denies taking any diet/weight loss medications or blood thinners. EMMI education declined by pt.  

## 2017-07-05 ENCOUNTER — Encounter: Payer: Medicaid Other | Admitting: Gastroenterology

## 2017-07-06 ENCOUNTER — Encounter: Payer: Self-pay | Admitting: Family Medicine

## 2017-07-06 ENCOUNTER — Ambulatory Visit (INDEPENDENT_AMBULATORY_CARE_PROVIDER_SITE_OTHER): Payer: Self-pay | Admitting: Family Medicine

## 2017-07-06 VITALS — BP 126/68 | HR 98 | Temp 97.5°F | Ht 70.0 in | Wt 176.2 lb

## 2017-07-06 DIAGNOSIS — L650 Telogen effluvium: Secondary | ICD-10-CM

## 2017-07-06 NOTE — Patient Instructions (Signed)
Thank you for coming in today, it was so nice to see you! Today we talked about:    Hair: Your hair and scalp look normal today. It is possible that low vitamin D in the past caused your hair to fall out. It can take a couple of months for your hair to come back to normal. Your last vitamin D was normal at 41.8 of 05/11/17. Normal vitamin D is above 30. Follow up with your regular doctor    If you have any questions or concerns, please do not hesitate to call the office at [phone]. You can also message me directly via MyChart.   Sincerely,  Anders Simmondshristina Sidonia Nutter, MD

## 2017-07-06 NOTE — Progress Notes (Signed)
   Subjective:    Patient ID: Kyle Herrera , male   DOB: 15-Nov-1970 , 47 y.o..   MRN: [id number]  HPI  Kyle Herrera is here for   1. Hair loss: Patient is coming today for reported hair loss.  He notes that about 8 months ago he noticed a diffuse loss in his hair, no discrete bald patches though.  He notes that his vitamin D was low at 15 and he was started on supplementation and his vitamin D came up to 34.  He comments that his last vitamin D was again low at 15 and he was sent to the dermatologist to see what is going on.  He notes that about 4-5 years ago he did have alopecia and was prescribed a cream to use on his head but he did not use it.  Review of Systems: Per HPI.   Past Medical History: Patient Active Problem List   Diagnosis Date Noted  . Alopecia areata 11/16/2015  . Environmental allergies 01/22/2015    Social Hx:  reports that he has never smoked. He has never used smokeless tobacco.   Objective:   BP 126/68 (BP Location: Left Arm, Patient Position: Sitting, Cuff Size: Normal)   Pulse 98   Temp (!) 97.5 F (36.4 C) (Oral)   Ht 5\' 10"  (1.778 m)   Wt 176 lb 3.2 oz (79.9 kg)   SpO2 97%   BMI 25.28 kg/m  Physical Exam  Gen: NAD, alert, cooperative with exam, well-appearing Skin: no rashes, normal turgor.  Normal distribution of hair on head, no discrete areas of baldness.  Scalp appears normal without any lesions.  Hair does not fall out when tugged.  Assessment & Plan:   1. Telogen effluvium: Prior diffuse hair loss possibly related to this when vitamin D was low as he states his hair growth improved with vitamin D supplementation.  Exam of scalp and hair normal today.  Has slightly receding hairline at the scalp which is normal for his age.  No signs of tinea capitis or alopecia on exam.   -Discussed with patient that there are no treatments or further workup we could offer at this time -He can possibly continue his vitamin D as this helped his hair,  will defer this to his PCP  Anders Simmondshristina Robbie Nangle, MD [facility], PGY-3

## 2017-07-19 ENCOUNTER — Ambulatory Visit (AMBULATORY_SURGERY_CENTER): Payer: Self-pay | Admitting: Gastroenterology

## 2017-07-19 ENCOUNTER — Other Ambulatory Visit: Payer: Self-pay

## 2017-07-19 ENCOUNTER — Encounter: Payer: Self-pay | Admitting: Gastroenterology

## 2017-07-19 VITALS — BP 104/56 | HR 66 | Temp 99.3°F | Resp 26 | Ht 70.0 in | Wt 176.0 lb

## 2017-07-19 DIAGNOSIS — Z8619 Personal history of other infectious and parasitic diseases: Secondary | ICD-10-CM

## 2017-07-19 DIAGNOSIS — R1013 Epigastric pain: Secondary | ICD-10-CM

## 2017-07-19 DIAGNOSIS — K2950 Unspecified chronic gastritis without bleeding: Secondary | ICD-10-CM

## 2017-07-19 MED ORDER — SODIUM CHLORIDE 0.9 % IV SOLN: 500.0000 mL | Freq: Once | INTRAVENOUS | Status: DC

## 2017-07-19 NOTE — Progress Notes (Signed)
Report given to PACU, vss 

## 2017-07-19 NOTE — Progress Notes (Signed)
Called to room to assist during endoscopic procedure.  Patient ID and intended procedure confirmed with present staff. Received instructions for my participation in the procedure from the performing physician.  

## 2017-07-19 NOTE — Op Note (Signed)
[facility] Patient Name: Kyle Herrera Procedure Date: 07/19/2017 4:12 PM MRN: [id number] Endoscopist: Viviann Spare P. Jaquawn Saffran MD, MD Age: 47 Referring MD:  Date of Birth: 1970-08-16 Gender: Male Account #: [id number] Procedure:                Upper GI endoscopy Indications:              Epigastric abdominal pain, history of H pylori s/p                            eradication. Initially had persistent discomfort                            after therapy for H pylori, over time after therapy                            symptoms have improved currently Medicines:                Monitored Anesthesia Care Procedure:                Pre-Anesthesia Assessment:                           - Prior to the procedure, a History and Physical                            was performed, and patient medications and                            allergies were reviewed. The patient's tolerance of                            previous anesthesia was also reviewed. The risks                            and benefits of the procedure and the sedation                            options and risks were discussed with the patient.                            All questions were answered, and informed consent                            was obtained. Prior Anticoagulants: The patient has                            taken no previous anticoagulant or antiplatelet                            agents. ASA Grade Assessment: II - A patient with                            mild systemic disease. After reviewing the risks  and benefits, the patient was deemed in                            satisfactory condition to undergo the procedure.                           After obtaining informed consent, the endoscope was                            passed under direct vision. Throughout the                            procedure, the patient's blood pressure, pulse, and                            oxygen saturations  were monitored continuously. The                            Endoscope was introduced through the mouth, and                            advanced to the second part of duodenum. The upper                            GI endoscopy was accomplished without difficulty.                            The patient tolerated the procedure well. Scope In: Scope Out: Findings:                 Esophagogastric landmarks were identified: the                            Z-line was found at 43 cm, the gastroesophageal                            junction was found at 43 cm and the upper extent of                            the gastric folds was found at 43 cm from the                            incisors.                           The exam of the esophagus was otherwise normal.                           Localized mildly erythematous mucosa was found in                            the gastric body without ulceration.                           The  exam of the stomach was otherwise normal.                           Biopsies were taken with a cold forceps in the                            gastric body, at the incisura and in the gastric                            antrum for Helicobacter pylori testing.                           Localized mildly erythematous mucosa was found in                            the duodenal bulb.                           The exam of the duodenum was otherwise normal. Complications:            No immediate complications. Estimated blood loss:                            Minimal. Estimated Blood Loss:     Estimated blood loss was minimal. Impression:               - Esophagogastric landmarks identified.                           - Normal esophagus                           - Very mildly erythematous mucosa in the gastric                            body without ulceration.                           - Normal stomach otherwise                           - Biopsies of the stomach taken for H  pylori                            eradication testing                           - Focal mild duodenal erythema                           - Normal duodenum otherwise Recommendation:           - Patient has a contact number available for                            emergencies. The signs and symptoms of potential  delayed complications were discussed with the                            patient. Return to normal activities tomorrow.                            Written discharge instructions were provided to the                            patient.                           - Resume previous diet.                           - Continue present medications.                           - Await pathology results. Viviann SpareSteven P. Rayshard Schirtzinger MD, MD 07/19/2017 4:26:12 PM This report has been signed electronically.

## 2017-07-19 NOTE — Patient Instructions (Signed)
YOU HAD AN ENDOSCOPIC PROCEDURE TODAY AT THE [facility]:   Refer to the procedure report that was given to you for any specific questions about what was found during the examination.  If the procedure report does not answer your questions, please call your gastroenterologist to clarify.  If you requested that your care partner not be given the details of your procedure findings, then the procedure report has been included in a sealed envelope for you to review at your convenience later.  YOU SHOULD EXPECT: Some feelings of bloating in the abdomen. Passage of more gas than usual.  Walking can help get rid of the air that was put into your GI tract during the procedure and reduce the bloating. If you had a lower endoscopy (such as a colonoscopy or flexible sigmoidoscopy) you may notice spotting of blood in your stool or on the toilet paper. If you underwent a bowel prep for your procedure, you may not have a normal bowel movement for a few days.  Please Note:  You might notice some irritation and congestion in your nose or some drainage.  This is from the oxygen used during your procedure.  There is no need for concern and it should clear up in a day or so.  SYMPTOMS TO REPORT IMMEDIATELY:   Following upper endoscopy (EGD)  Vomiting of blood or coffee ground material  New chest pain or pain under the shoulder blades  Painful or persistently difficult swallowing  New shortness of breath  Fever of 100F or higher  Black, tarry-looking stools  For urgent or emergent issues, a gastroenterologist can be reached at any hour by calling [phone].   DIET:  We do recommend a small meal at first, but then you may proceed to your regular diet.  Drink plenty of fluids but you should avoid alcoholic beverages for 24 hours.  ACTIVITY:  You should plan to take it easy for the rest of today and you should NOT DRIVE or use heavy machinery until tomorrow (because of the sedation medicines used  during the test).    FOLLOW UP: Our staff will call the number listed on your records the next business day following your procedure to check on you and address any questions or concerns that you may have regarding the information given to you following your procedure. If we do not reach you, we will leave a message.  However, if you are feeling well and you are not experiencing any problems, there is no need to return our call.  We will assume that you have returned to your regular daily activities without incident.  If any biopsies were taken you will be contacted by phone or by letter within the next 1-3 weeks.  Please call us at [phone] if you have not heard about the biopsies in 3 weeks.    SIGNATURES/CONFIDENTIALITY: You and/or your care partner have signed paperwork which will be entered into your electronic medical record.  These signatures attest to the fact that that the information above on your After Visit Summary has been reviewed and is understood.  Full responsibility of the confidentiality of this discharge information lies with you and/or your care-partner. 

## 2017-07-20 ENCOUNTER — Telehealth: Payer: Self-pay | Admitting: *Deleted

## 2017-07-20 ENCOUNTER — Telehealth: Payer: Self-pay

## 2017-07-20 NOTE — Telephone Encounter (Signed)
No answer, left message to call if questions or concerns. 

## 2017-07-20 NOTE — Telephone Encounter (Signed)
Attempted to reach pt with follow-up call following endoscopic procedure 07/19/2017.  LM on pt. Voice mail to call if he has any questions or concerns.

## 2017-07-26 ENCOUNTER — Encounter: Payer: Self-pay | Admitting: Gastroenterology

## 2017-08-07 ENCOUNTER — Ambulatory Visit: Payer: Medicaid Other | Admitting: Family Medicine

## 2017-08-07 ENCOUNTER — Encounter: Payer: Self-pay | Admitting: Internal Medicine

## 2017-08-07 ENCOUNTER — Ambulatory Visit: Payer: Self-pay | Attending: Family Medicine | Admitting: Internal Medicine

## 2017-08-07 VITALS — BP 116/75 | HR 64 | Temp 98.6°F | Resp 16 | Wt 176.6 lb

## 2017-08-07 DIAGNOSIS — F17210 Nicotine dependence, cigarettes, uncomplicated: Secondary | ICD-10-CM | POA: Insufficient documentation

## 2017-08-07 DIAGNOSIS — R5383 Other fatigue: Secondary | ICD-10-CM | POA: Insufficient documentation

## 2017-08-07 DIAGNOSIS — L639 Alopecia areata, unspecified: Secondary | ICD-10-CM | POA: Insufficient documentation

## 2017-08-07 DIAGNOSIS — Z79899 Other long term (current) drug therapy: Secondary | ICD-10-CM | POA: Insufficient documentation

## 2017-08-07 DIAGNOSIS — R509 Fever, unspecified: Secondary | ICD-10-CM | POA: Insufficient documentation

## 2017-08-07 DIAGNOSIS — R61 Generalized hyperhidrosis: Secondary | ICD-10-CM | POA: Insufficient documentation

## 2017-08-07 DIAGNOSIS — L659 Nonscarring hair loss, unspecified: Secondary | ICD-10-CM

## 2017-08-07 DIAGNOSIS — Z7951 Long term (current) use of inhaled steroids: Secondary | ICD-10-CM | POA: Insufficient documentation

## 2017-08-07 NOTE — Progress Notes (Signed)
Pt states he has been having a fever  Pt states at night he has the sweats  Pt states he has been feeling weak

## 2017-08-07 NOTE — Progress Notes (Signed)
Patient ID: Kyle Herrera, male    DOB: 01-03-1971  MRN: [id number]  CC: URI   Subjective: Kyle Herrera is a 47 y.o. male who presents for  UC visit His concerns today include:   C/o inc night sweats over past 2 mths Subjective fever  for past 1 wk.  "Feels slow fever inside."  He feels that he has typhoid fever.  States that he has had it several times in [place] and knows what it feels like. Feels weak. No rash, abdominal pain or diarrhea/constipation/headache.  No recent travels No swollen LN, N/V.  Loss 1-2 lbs. -He is having some hair loss and request to be checked for anemia and thyroid  Patient Active Problem List   Diagnosis Date Noted  . Alopecia areata 11/16/2015  . Environmental allergies 01/22/2015     Current Outpatient Medications on File Prior to Visit  Medication Sig Dispense Refill  . albuterol (PROVENTIL HFA;VENTOLIN HFA) 108 (90 BASE) MCG/ACT inhaler Inhale 1-2 puffs into the lungs every 6 (six) hours as needed for wheezing or shortness of breath. (Patient not taking: Reported on 06/12/2017) 8 g 3  . cetirizine (ZYRTEC) 10 MG tablet Take 1 tablet (10 mg total) by mouth daily. 30 tablet 5  . Cholecalciferol (VITAMIN D PO) Take 1 tablet by mouth daily.    . ferrous sulfate 325 (65 FE) MG EC tablet Take 325 mg by mouth 3 (three) times daily with meals.    . fluticasone (FLONASE) 50 MCG/ACT nasal spray Place 2 sprays into both nostrils daily. 16 g 6  . MINOXIDIL, TOPICAL, 5 % SOLN Apply 1 mL topically 2 (two) times daily. To scalp. 120 mL 2  . montelukast (SINGULAIR) 10 MG tablet Take 1 tablet (10 mg total) by mouth at bedtime. 30 tablet 3  . omeprazole (PRILOSEC) 20 MG capsule Take 1 capsule (20 mg total) by mouth 2 (two) times daily before a meal. (Patient not taking: Reported on 06/12/2017) 60 capsule 2  . sucralfate (CARAFATE) 1 g tablet Take 1 tablet (1 g total) by mouth 4 (four) times daily -  with meals and at bedtime. (Patient not taking: Reported on 06/12/2017)  120 tablet 0   Current Facility-Administered Medications on File Prior to Visit  Medication Dose Route Frequency Provider Last Rate Last Dose  . 0.9 %  sodium chloride infusion  500 mL Intravenous Once Armbruster, Willaim Rayas, MD        No Known Allergies  Social History   Socioeconomic History  . Marital status: Married    Spouse name: Not on file  . Number of children: 2  . Years of education: Not on file  . Highest education level: Not on file  Occupational History  . Occupation: Designer, jewellery   Social Needs  . Financial resource strain: Not on file  . Food insecurity:    Worry: Not on file    Inability: Not on file  . Transportation needs:    Medical: Not on file    Non-medical: Not on file  Tobacco Use  . Smoking status: Light Tobacco Smoker  . Smokeless tobacco: Never Used  . Tobacco comment: smokes occasionally   Substance and Sexual Activity  . Alcohol use: No  . Drug use: No  . Sexual activity: Not on file  Lifestyle  . Physical activity:    Days per week: Not on file    Minutes per session: Not on file  . Stress: Not on file  Relationships  .  Social connections:    Talks on phone: Not on file    Gets together: Not on file    Attends religious service: Not on file    Active member of club or organization: Not on file    Attends meetings of clubs or organizations: Not on file    Relationship status: Not on file  . Intimate partner violence:    Fear of current or ex partner: Not on file    Emotionally abused: Not on file    Physically abused: Not on file    Forced sexual activity: Not on file  Other Topics Concern  . Not on file  Social History Narrative   Moved from [place] in 2013     Family History  Problem Relation Age of Onset  . Colon cancer Neg Hx   . Esophageal cancer Neg Hx   . Stomach cancer Neg Hx     Past Surgical History:  Procedure Laterality Date  . TONSILLECTOMY      ROS: Review of Systems Negative except as above PHYSICAL  EXAM: BP 116/75   Pulse 64   Temp 98.6 F (37 C) (Oral)   Resp 16   Wt 176 lb 9.6 oz (80.1 kg)   SpO2 99%   BMI 25.34 kg/m   Wt Readings from Last 3 Encounters:  08/07/17 176 lb 9.6 oz (80.1 kg)  07/19/17 176 lb (79.8 kg)  07/06/17 176 lb 3.2 oz (79.9 kg)   Physical Exam  General appearance - alert, well appearing, and in no distress Mental status - normal mood, behavior, speech, dress, motor activity, and thought processes Mouth - mucous membranes moist, pharynx normal without lesions Neck - supple, no significant adenopathy Lymphatics - no palpable lymphadenopathy, no hepatosplenomegaly Chest - clear to auscultation, no wheezes, rales or rhonchi, symmetric air entry Heart - normal rate, regular rhythm, normal S1, S2, no murmurs, rubs, clicks or gallops Abdomen - soft, nontender, nondistended, no masses or organomegaly Extremities - peripheral pulses normal, no pedal edema, no clubbing or cyanosis Hair: Appears to have thick hair with no areas of alopecia  ASSESSMENT AND PLAN: 1. Hair loss - TSH  2. Fatigue, unspecified type 3. Night sweats -Besides subjective fever and fatigue he does not have a lot of other symptoms that would point in the direction of typhoid fever.  While I was examining him he requested to have his temperature checked because he states he was having fever now I did so on temperature was normal.  In any event we will check some baseline blood work and get a set of cultures - Comprehensive metabolic panel - CBC with Differential/Platelet - Culture, blood (single) - Culture, blood (single)   Patient was given the opportunity to ask questions.  Patient verbalized understanding of the plan and was able to repeat key elements of the plan.   Orders Placed This Encounter  Procedures  . Culture, blood (single)  . Culture, blood (single)  . Comprehensive metabolic panel  . TSH  . CBC with Differential/Platelet     Requested Prescriptions    No  prescriptions requested or ordered in this encounter    No follow-ups on file.  Jonah Blue, MD, FACP

## 2017-08-08 ENCOUNTER — Telehealth: Payer: Self-pay | Admitting: Internal Medicine

## 2017-08-08 LAB — TSH: TSH: 1.23 u[IU]/mL (ref 0.450–4.500)

## 2017-08-08 LAB — CBC WITH DIFFERENTIAL/PLATELET
BASOS: 1 %
Basophils Absolute: 0 10*3/uL (ref 0.0–0.2)
EOS (ABSOLUTE): 0.2 10*3/uL (ref 0.0–0.4)
Eos: 5 %
Hematocrit: 44.7 % (ref 37.5–51.0)
Hemoglobin: 15.5 g/dL (ref 13.0–17.7)
IMMATURE GRANS (ABS): 0 10*3/uL (ref 0.0–0.1)
Immature Granulocytes: 0 %
LYMPHS ABS: 1.7 10*3/uL (ref 0.7–3.1)
LYMPHS: 37 %
MCH: 31.3 pg (ref 26.6–33.0)
MCHC: 34.7 g/dL (ref 31.5–35.7)
MCV: 90 fL (ref 79–97)
Monocytes Absolute: 0.3 10*3/uL (ref 0.1–0.9)
Monocytes: 7 %
NEUTROS ABS: 2.3 10*3/uL (ref 1.4–7.0)
Neutrophils: 50 %
PLATELETS: 217 10*3/uL (ref 150–379)
RBC: 4.95 x10E6/uL (ref 4.14–5.80)
RDW: 13.9 % (ref 12.3–15.4)
WBC: 4.5 10*3/uL (ref 3.4–10.8)

## 2017-08-08 LAB — COMPREHENSIVE METABOLIC PANEL
A/G RATIO: 1.8 (ref 1.2–2.2)
ALBUMIN: 4.6 g/dL (ref 3.5–5.5)
ALK PHOS: 70 IU/L (ref 39–117)
ALT: 19 IU/L (ref 0–44)
AST: 20 IU/L (ref 0–40)
BILIRUBIN TOTAL: 0.4 mg/dL (ref 0.0–1.2)
BUN / CREAT RATIO: 15 (ref 9–20)
BUN: 12 mg/dL (ref 6–24)
CHLORIDE: 103 mmol/L (ref 96–106)
CO2: 21 mmol/L (ref 20–29)
Calcium: 9.6 mg/dL (ref 8.7–10.2)
Creatinine, Ser: 0.79 mg/dL (ref 0.76–1.27)
GFR calc non Af Amer: 108 mL/min/{1.73_m2} (ref >59)
GFR, EST AFRICAN AMERICAN: 124 mL/min/{1.73_m2} (ref >59)
Globulin, Total: 2.5 g/dL (ref 1.5–4.5)
Glucose: 91 mg/dL (ref 65–99)
POTASSIUM: 4.6 mmol/L (ref 3.5–5.2)
SODIUM: 139 mmol/L (ref 134–144)
TOTAL PROTEIN: 7.1 g/dL (ref 6.0–8.5)

## 2017-08-08 NOTE — Telephone Encounter (Signed)
Pt came to the office to get the lab result, the test was done 08/07/17.please follow up

## 2017-08-10 ENCOUNTER — Telehealth: Payer: Self-pay | Admitting: Internal Medicine

## 2017-08-10 NOTE — Telephone Encounter (Signed)
Patient requested for iron labs to be done, please fu at your earliest convenience.

## 2017-08-10 NOTE — Telephone Encounter (Signed)
Pt is aware of lab results.

## 2017-08-10 NOTE — Telephone Encounter (Signed)
Pt came back by the office and I informed pt that he doesn't need iron levels drawn because his hemoglobin was in normal range. I informed pt that the only time we add iron levels on or check iron levels is when the hemoglobin is low. Pt states he understands and doesn't have any questions or concerns

## 2017-08-14 LAB — CULTURE, BLOOD (SINGLE)

## 2017-08-17 ENCOUNTER — Ambulatory Visit: Payer: Medicaid Other | Admitting: Internal Medicine

## 2017-09-06 ENCOUNTER — Ambulatory Visit: Payer: Self-pay | Attending: Family Medicine

## 2017-09-10 ENCOUNTER — Other Ambulatory Visit: Payer: Self-pay

## 2017-09-10 ENCOUNTER — Encounter (HOSPITAL_COMMUNITY): Payer: Self-pay | Admitting: *Deleted

## 2017-09-10 ENCOUNTER — Emergency Department (HOSPITAL_COMMUNITY)
Admission: EM | Admit: 2017-09-10 | Discharge: 2017-09-10 | Disposition: A | Discharge status: Home / Self Care | Payer: Medicaid Other | Attending: Emergency Medicine | Admitting: Emergency Medicine

## 2017-09-10 ENCOUNTER — Emergency Department (HOSPITAL_COMMUNITY): Payer: Medicaid Other

## 2017-09-10 DIAGNOSIS — F17200 Nicotine dependence, unspecified, uncomplicated: Secondary | ICD-10-CM | POA: Insufficient documentation

## 2017-09-10 DIAGNOSIS — R0789 Other chest pain: Secondary | ICD-10-CM | POA: Insufficient documentation

## 2017-09-10 DIAGNOSIS — Z79899 Other long term (current) drug therapy: Secondary | ICD-10-CM | POA: Insufficient documentation

## 2017-09-10 LAB — BASIC METABOLIC PANEL
ANION GAP: 6 (ref 5–15)
BUN: 14 mg/dL (ref 6–20)
CO2: 28 mmol/L (ref 22–32)
CREATININE: 0.99 mg/dL (ref 0.61–1.24)
Calcium: 9.1 mg/dL (ref 8.9–10.3)
Chloride: 103 mmol/L (ref 101–111)
GFR calc Af Amer: >60 mL/min (ref >60)
Glucose, Bld: 98 mg/dL (ref 65–99)
POTASSIUM: 3.8 mmol/L (ref 3.5–5.1)
Sodium: 137 mmol/L (ref 135–145)

## 2017-09-10 LAB — CBC
HCT: 43.9 % (ref 39.0–52.0)
Hemoglobin: 14.8 g/dL (ref 13.0–17.0)
MCH: 30.1 pg (ref 26.0–34.0)
MCHC: 33.7 g/dL (ref 30.0–36.0)
MCV: 89.2 fL (ref 78.0–100.0)
PLATELETS: 195 10*3/uL (ref 150–400)
RBC: 4.92 MIL/uL (ref 4.22–5.81)
RDW: 11.9 % (ref 11.5–15.5)
WBC: 4.3 10*3/uL (ref 4.0–10.5)

## 2017-09-10 LAB — I-STAT TROPONIN, ED: TROPONIN I, POC: 0 ng/mL (ref 0.00–0.08)

## 2017-09-10 MED ORDER — CYCLOBENZAPRINE HCL 10 MG PO TABS: 10.0000 mg | ORAL_TABLET | Freq: Two times a day (BID) | ORAL | 0 refills | Status: DC | PRN

## 2017-09-10 MED ORDER — IBUPROFEN 600 MG PO TABS: 600.0000 mg | ORAL_TABLET | Freq: Four times a day (QID) | ORAL | 0 refills | Status: DC | PRN

## 2017-09-10 NOTE — ED Provider Notes (Signed)
[facility] EMERGENCY DEPARTMENT Provider Note   CSN: [id number] Arrival date & time: 09/10/17  0501     History   Chief Complaint Chief Complaint  Patient presents with  . Chest Pain    HPI Kyle Herrera is a 47 y.o. male.  HPI  47 y/o with cc of chest pain. Chest pain started y'day.  Pain is described as left-sided, throbbing pain located around the axilla.  Pain is fairly constant, with intermittent worsening of the symptoms.  Pain is nonradiating and has no aggravating or relieving factors.   Patient denies any specific evoking factor.  He does indicate that at work his work involves frequent lifting, pulling and pushing.  Review of system is negative for any numbness, tingling, shortness of breath, diaphoresis.  Patient has no medical problems and he denies any heavy smoking.  Past Medical History:  Diagnosis Date  . Allergy   . H. pylori infection     Patient Active Problem List   Diagnosis Date Noted  . Alopecia areata 11/16/2015  . Environmental allergies 01/22/2015    Past Surgical History:  Procedure Laterality Date  . TONSILLECTOMY          Home Medications    Prior to Admission medications   Medication Sig Start Date End Date Taking? Authorizing Provider  Biotin w/ Vitamins C & E (HAIR SKIN & NAILS GUMMIES) 1250-7.5-7.5 MCG-MG-UNT CHEW Chew 1 tablet by mouth daily.   Yes [provider]  cetirizine (ZYRTEC) 10 MG tablet Take 1 tablet (10 mg total) by mouth daily. 01/22/15  Yes Funches, Josalyn, MD  albuterol (PROVENTIL HFA;VENTOLIN HFA) 108 (90 BASE) MCG/ACT inhaler Inhale 1-2 puffs into the lungs every 6 (six) hours as needed for wheezing or shortness of breath. Patient not taking: Reported on 06/12/2017 01/22/15   Dessa Phi, MD  cyclobenzaprine (FLEXERIL) 10 MG tablet Take 1 tablet (10 mg total) by mouth 2 (two) times daily as needed for muscle spasms. 09/10/17   Derwood Kaplan, MD  fluticasone (FLONASE) 50 MCG/ACT  nasal spray Place 2 sprays into both nostrils daily. Patient not taking: Reported on 09/10/2017 01/22/15   Dessa Phi, MD  ibuprofen (ADVIL,MOTRIN) 600 MG tablet Take 1 tablet (600 mg total) by mouth every 6 (six) hours as needed. 09/10/17   Derwood Kaplan, MD  MINOXIDIL, TOPICAL, 5 % SOLN Apply 1 mL topically 2 (two) times daily. To scalp. Patient not taking: Reported on 09/10/2017 10/17/16   Lizbeth Bark, FNP  montelukast (SINGULAIR) 10 MG tablet Take 1 tablet (10 mg total) by mouth at bedtime. Patient not taking: Reported on 09/10/2017 01/22/15   Dessa Phi, MD  omeprazole (PRILOSEC) 20 MG capsule Take 1 capsule (20 mg total) by mouth 2 (two) times daily before a meal. Patient not taking: Reported on 06/12/2017 05/11/17   Lizbeth Bark, FNP  sucralfate (CARAFATE) 1 g tablet Take 1 tablet (1 g total) by mouth 4 (four) times daily -  with meals and at bedtime. Patient not taking: Reported on 06/12/2017 04/21/17   Roxy Horseman, PA-C    Family History Family History  Problem Relation Age of Onset  . Colon cancer Neg Hx   . Esophageal cancer Neg Hx   . Stomach cancer Neg Hx     Social History Social History   Tobacco Use  . Smoking status: Light Tobacco Smoker  . Smokeless tobacco: Never Used  . Tobacco comment: smokes occasionally   Substance Use Topics  . Alcohol use: No  .  Drug use: No     Allergies   Patient has no known allergies.   Review of Systems Review of Systems  Constitutional: Negative for activity change.  Respiratory: Positive for chest tightness.   Cardiovascular: Positive for chest pain.  Gastrointestinal: Negative for nausea and vomiting.  Neurological: Negative for weakness and numbness.  Hematological: Does not bruise/bleed easily.  All other systems reviewed and are negative.    Physical Exam Updated Vital Signs BP 122/82 (BP Location: Right Arm)   Pulse 67   Temp 97.8 F (36.6 C) (Oral)   Resp 16   SpO2 100%    Physical Exam  Constitutional: He is oriented to person, place, and time. He appears well-developed.  HENT:  Head: Atraumatic.  Neck: Neck supple.  Cardiovascular: Normal rate, intact distal pulses and normal pulses.  Pulmonary/Chest: Effort normal.  Musculoskeletal:  Reproducible left chest wall pain, at the border of lateral pectoralis major muscle.  Patient also has a palpable nodule.  Neurological: He is alert and oriented to person, place, and time.  Skin: Skin is warm.  Nursing note and vitals reviewed.    ED Treatments / Results  Labs (all labs ordered are listed, but only abnormal results are displayed) Labs Reviewed  BASIC METABOLIC PANEL  CBC  I-STAT TROPONIN, ED    EKG EKG Interpretation  Date/Time:  "Sunday September 10 2017 05:07:04 EDT Ventricular Rate:  76 PR Interval:  162 QRS Duration: 92 QT Interval:  388 QTC Calculation: 436 R Axis:   87 Text Interpretation:  Normal sinus rhythm Normal ECG No acute changes No significant change since last tracing No acute changes Confirmed by Hannalee Castor ([id number]) on 09/10/2017 7:06:42 AM   Radiology Dg Chest 2 View  Result Date: 09/10/2017 CLINICAL DATA:  Chest pain. EXAM: CHEST - 2 VIEW COMPARISON:  03/16/2015 FINDINGS: Diffuse chronic bronchial thickening, unchanged. Borderline hyperinflation. The cardiomediastinal contours are normal. The lungs are clear. Pulmonary vasculature is normal. No consolidation, pleural effusion, or pneumothorax. No acute osseous abnormalities are seen. IMPRESSION: Chronic bronchial thickening and borderline hyperinflation. No acute chest finding. Electronically Signed   By: Melanie  Ehinger M.D.   On: 09/10/2017 05:30    Procedures Procedures (including critical care time)  Medications Ordered in ED Medications - No data to display   Initial Impression / Assessment and Plan / ED Course  I have reviewed the triage vital signs and the nursing notes.  Pertinent labs & imaging results  that were available during my care of the patient were reviewed by me and considered in my medical decision making (see chart for details).     46 year old with no cardiac risk factors and EKG without any acute findings comes in with chief complaint of left sided chest pain.  Location of the pain is lateral to the pectoralis major muscle, i.e., it is very focal and reproducible with palpation.  It seems like patient's work involves a lot of pulling and pushing  -which could have contributed.  He has no risk factors for ACS.  Initial work-up is negative, I feel comfortable discharging the patient.  He has been advised to return to the ER if his symptoms get worse.  Final Clinical Impressions(s) / ED Diagnoses   Final diagnoses:  Chest wall pain    ED Discharge Orders        Ordered    ibuprofen (ADVIL,MOTRIN) 600 MG tablet  Every 6 hours PRN     06" /16/19 0728    cyclobenzaprine (FLEXERIL) 10 MG  tablet  2 times daily PRN     09/10/17 4540       Derwood Kaplan, MD 09/10/17 [phone]

## 2017-09-10 NOTE — ED Triage Notes (Signed)
Pt having intermittent shooting pains for the past 3 days, denies sob. Also reports headache

## 2017-09-10 NOTE — ED Notes (Signed)
Pt stable, ambulatory, and verbalizes understanding of d/c instructions.  Pt currently pain free.

## 2017-09-10 NOTE — Discharge Instructions (Addendum)
We saw you in the ER for the chest pain. All of our cardiac workup is normal, including labs, EKG and chest X-RAY are normal. We think your discomfort is from chest wall spasm. Please massage the area of pain and place warm compresses.  The workup in the ER is not complete, and you should follow up with your primary care doctor for further evaluation.

## 2017-09-12 ENCOUNTER — Ambulatory Visit: Payer: Self-pay | Attending: Internal Medicine | Admitting: Internal Medicine

## 2017-09-12 ENCOUNTER — Encounter: Payer: Self-pay | Admitting: Internal Medicine

## 2017-09-12 VITALS — BP 109/73 | HR 61 | Temp 98.5°F | Resp 16 | Wt 176.0 lb

## 2017-09-12 DIAGNOSIS — F17210 Nicotine dependence, cigarettes, uncomplicated: Secondary | ICD-10-CM | POA: Insufficient documentation

## 2017-09-12 DIAGNOSIS — Z13220 Encounter for screening for lipoid disorders: Secondary | ICD-10-CM | POA: Insufficient documentation

## 2017-09-12 DIAGNOSIS — L659 Nonscarring hair loss, unspecified: Secondary | ICD-10-CM

## 2017-09-12 DIAGNOSIS — Z79899 Other long term (current) drug therapy: Secondary | ICD-10-CM | POA: Insufficient documentation

## 2017-09-12 DIAGNOSIS — L639 Alopecia areata, unspecified: Secondary | ICD-10-CM | POA: Insufficient documentation

## 2017-09-12 MED ORDER — LORATADINE 10 MG PO TABS: 10.0000 mg | ORAL_TABLET | Freq: Every day | ORAL | 2 refills | Status: DC

## 2017-09-12 MED FILL — ?LORATADINE 10 MG TABS: 10 | 30 days supply | Qty: 30 | Fill #0

## 2017-09-12 NOTE — Progress Notes (Signed)
Patient ID: Grayce SessionsSia F Talbot, male    DOB: 27-Dec-1970  MRN: [id number]  CC: Hospitalization Follow-up (ED)   Subjective: Nasif Louellen MolderJaff is a 47 y.o. male who presents for UC visit His concerns today include:   Patient complains of hair loss for several weeks.  He reports dark coloring in his hair.  He notices that 3-4 hrs after he takes Zyrtec or eats cheese, the hair breaks easily.  Wakes up in the morning with hair on his pillow.  He has H level 1 month ago was normal.  Wants to have all of his vitamin levels checked including vitamin C and B12.  He is also requesting to have lipid profile done  Patient Active Problem List   Diagnosis Date Noted  . Alopecia areata 11/16/2015  . Environmental allergies 01/22/2015     Current Outpatient Medications on File Prior to Visit  Medication Sig Dispense Refill  . albuterol (PROVENTIL HFA;VENTOLIN HFA) 108 (90 BASE) MCG/ACT inhaler Inhale 1-2 puffs into the lungs every 6 (six) hours as needed for wheezing or shortness of breath. (Patient not taking: Reported on 06/12/2017) 8 g 3  . Biotin w/ Vitamins C & E (HAIR SKIN & NAILS GUMMIES) 1250-7.5-7.5 MCG-MG-UNT CHEW Chew 1 tablet by mouth daily.    . cyclobenzaprine (FLEXERIL) 10 MG tablet Take 1 tablet (10 mg total) by mouth 2 (two) times daily as needed for muscle spasms. 20 tablet 0  . fluticasone (FLONASE) 50 MCG/ACT nasal spray Place 2 sprays into both nostrils daily. (Patient not taking: Reported on 09/10/2017) 16 g 6  . ibuprofen (ADVIL,MOTRIN) 600 MG tablet Take 1 tablet (600 mg total) by mouth every 6 (six) hours as needed. 30 tablet 0  . MINOXIDIL, TOPICAL, 5 % SOLN Apply 1 mL topically 2 (two) times daily. To scalp. (Patient not taking: Reported on 09/10/2017) 120 mL 2  . montelukast (SINGULAIR) 10 MG tablet Take 1 tablet (10 mg total) by mouth at bedtime. (Patient not taking: Reported on 09/10/2017) 30 tablet 3  . omeprazole (PRILOSEC) 20 MG capsule Take 1 capsule (20 mg total) by mouth 2 (two) times  daily before a meal. (Patient not taking: Reported on 06/12/2017) 60 capsule 2  . sucralfate (CARAFATE) 1 g tablet Take 1 tablet (1 g total) by mouth 4 (four) times daily -  with meals and at bedtime. (Patient not taking: Reported on 06/12/2017) 120 tablet 0   Current Facility-Administered Medications on File Prior to Visit  Medication Dose Route Frequency Provider Last Rate Last Dose  . 0.9 %  sodium chloride infusion  500 mL Intravenous Once Armbruster, Willaim RayasSteven P, MD        No Known Allergies  Social History   Socioeconomic History  . Marital status: Married    Spouse name: Not on file  . Number of children: 2  . Years of education: Not on file  . Highest education level: Not on file  Occupational History  . Occupation: Designer, [employer]   Social Needs  . Financial resource strain: Not on file  . Food insecurity:    Worry: Not on file    Inability: Not on file  . Transportation needs:    Medical: Not on file    Non-medical: Not on file  Tobacco Use  . Smoking status: Light Tobacco Smoker  . Smokeless tobacco: Never Used  . Tobacco comment: smokes occasionally   Substance and Sexual Activity  . Alcohol use: No  . Drug use: No  . Sexual  activity: Not on file  Lifestyle  . Physical activity:    Days per week: Not on file    Minutes per session: Not on file  . Stress: Not on file  Relationships  . Social connections:    Talks on phone: Not on file    Gets together: Not on file    Attends religious service: Not on file    Active member of club or organization: Not on file    Attends meetings of clubs or organizations: Not on file    Relationship status: Not on file  . Intimate partner violence:    Fear of current or ex partner: Not on file    Emotionally abused: Not on file    Physically abused: Not on file    Forced sexual activity: Not on file  Other Topics Concern  . Not on file  Social History Narrative   Moved from [place] in 2013     Family History  Problem  Relation Age of Onset  . Colon cancer Neg Hx   . Esophageal cancer Neg Hx   . Stomach cancer Neg Hx     Past Surgical History:  Procedure Laterality Date  . TONSILLECTOMY      ROS: Review of Systems Negative except as stated above PHYSICAL EXAM: BP 109/73   Pulse 61   Temp 98.5 F (36.9 C) (Oral)   Resp 16   Wt 176 lb (79.8 kg)   SpO2 98%   BMI 25.25 kg/m   Physical Exam  General appearance - alert, well appearing, and in no distress Skin - Hair:  Pt has thick hair.  Shaft does not break easily with pulling on the hair.  No alopecia spots noted  ASSESSMENT AND PLAN: 1. Hair loss Advised to stop putting dye in the hair.  No need to check vitamin levels Zyrtec very rarely can cause alopecia.  Advised to discontinue the Zyrtec and change to Claritin instead - Ambulatory referral to Dermatology  2. Screening cholesterol level - Lipid panel  Patient was given the opportunity to ask questions.  Patient verbalized understanding of the plan and was able to repeat key elements of the plan.   Orders Placed This Encounter  Procedures  . Lipid panel  . Ambulatory referral to Dermatology     Requested Prescriptions   Signed Prescriptions Disp Refills  . loratadine (CLARITIN) 10 MG tablet 30 tablet 2    Sig: Take 1 tablet (10 mg total) by mouth daily.    No follow-ups on file.  Jonah Blue, MD, FACP

## 2017-09-13 ENCOUNTER — Telehealth: Payer: Self-pay

## 2017-09-13 LAB — LIPID PANEL
CHOL/HDL RATIO: 3.6 ratio (ref 0.0–5.0)
CHOLESTEROL TOTAL: 184 mg/dL (ref 100–199)
HDL: 51 mg/dL (ref >39)
LDL CALC: 112 mg/dL — AB (ref 0–99)
Triglycerides: 107 mg/dL (ref 0–149)
VLDL Cholesterol Cal: 21 mg/dL (ref 5–40)

## 2017-09-13 NOTE — Telephone Encounter (Signed)
Contacted pt to go over lab results pt is aware and doesn't have any questions or concerns 

## 2017-09-22 ENCOUNTER — Ambulatory Visit: Payer: Self-pay | Attending: Internal Medicine

## 2017-10-21 ENCOUNTER — Encounter (HOSPITAL_COMMUNITY): Payer: Self-pay | Admitting: Emergency Medicine

## 2017-10-21 ENCOUNTER — Emergency Department (HOSPITAL_COMMUNITY)
Admission: EM | Admit: 2017-10-21 | Discharge: 2017-10-21 | Disposition: A | Discharge status: Home / Self Care | Payer: Self-pay | Attending: Emergency Medicine | Admitting: Emergency Medicine

## 2017-10-21 ENCOUNTER — Emergency Department (HOSPITAL_COMMUNITY): Payer: Self-pay

## 2017-10-21 DIAGNOSIS — Z79899 Other long term (current) drug therapy: Secondary | ICD-10-CM | POA: Insufficient documentation

## 2017-10-21 DIAGNOSIS — M436 Torticollis: Secondary | ICD-10-CM | POA: Insufficient documentation

## 2017-10-21 DIAGNOSIS — F17200 Nicotine dependence, unspecified, uncomplicated: Secondary | ICD-10-CM | POA: Insufficient documentation

## 2017-10-21 MED ORDER — CYCLOBENZAPRINE HCL 10 MG PO TABS: 10.0000 mg | ORAL_TABLET | Freq: Two times a day (BID) | ORAL | 0 refills | Status: DC | PRN

## 2017-10-21 MED ORDER — PREDNISONE 20 MG PO TABS: ORAL_TABLET | ORAL | 0 refills | Status: DC

## 2017-10-21 MED ORDER — IBUPROFEN 600 MG PO TABS: 600.0000 mg | ORAL_TABLET | Freq: Four times a day (QID) | ORAL | 0 refills | Status: DC | PRN

## 2017-10-21 NOTE — ED Provider Notes (Signed)
[facility] Provider Note   CSN: [id number] Arrival date & time: 10/21/17  0740     History   Chief Complaint Chief Complaint  Patient presents with  . Neck Pain    HPI Kyle Herrera is a 47 y.o. male.  The history is provided by the patient. No language interpreter was used.  Neck Pain   Pertinent negatives include no numbness and no headaches.     47 year old male presenting for evaluation of neck pain.  Patient report for the past 3 weeks he has had recurrent left-sided neck pain.  Pain is described as a sharp shooting pain up towards his skull and down towards his left posterior shoulder.  Sometimes he hears "crunching sounds" with neck movement which concerns him.  Pain is waxing waning, more noticeable at rest and less noticeable while he walks.  He denies any associated fever, confusion, vision changes, severe headache, hearing changes, trouble swallowing, arm weakness or numbness, chest pain or trouble breathing.  He does endorse increasing pain when he turns his neck to the left side.  He denies any specific treatment tried at home.  He is left-hand dominant.  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  Past Medical History:  Diagnosis Date  . Allergy   . H. pylori infection     Patient Active Problem List   Diagnosis Date Noted  . Alopecia areata 11/16/2015  . Environmental allergies 01/22/2015    Past Surgical History:  Procedure Laterality Date  . TONSILLECTOMY          Home Medications    Prior to Admission medications   Medication Sig Start Date End Date Taking? Authorizing Provider  albuterol (PROVENTIL HFA;VENTOLIN HFA) 108 (90 BASE) MCG/ACT inhaler Inhale 1-2 puffs into the lungs every 6 (six) hours as needed for wheezing or shortness of breath. Patient not taking: Reported on 06/12/2017 01/22/15   Dessa PhiFunches, Josalyn, MD  Biotin w/ Vitamins C & E (HAIR SKIN & NAILS GUMMIES) 1250-7.5-7.5 MCG-MG-UNT CHEW Chew 1  tablet by mouth daily.    [provider]  cyclobenzaprine (FLEXERIL) 10 MG tablet Take 1 tablet (10 mg total) by mouth 2 (two) times daily as needed for muscle spasms. 09/10/17   Derwood KaplanNanavati, Ankit, MD  fluticasone (FLONASE) 50 MCG/ACT nasal spray Place 2 sprays into both nostrils daily. Patient not taking: Reported on 09/10/2017 01/22/15   Dessa PhiFunches, Josalyn, MD  ibuprofen (ADVIL,MOTRIN) 600 MG tablet Take 1 tablet (600 mg total) by mouth every 6 (six) hours as needed. 09/10/17   Derwood KaplanNanavati, Ankit, MD  loratadine (CLARITIN) 10 MG tablet Take 1 tablet (10 mg total) by mouth daily. 09/12/17   Marcine MatarJohnson, Deborah B, MD  MINOXIDIL, TOPICAL, 5 % SOLN Apply 1 mL topically 2 (two) times daily. To scalp. Patient not taking: Reported on 09/10/2017 10/17/16   Lizbeth BarkHairston, Mandesia R, FNP  montelukast (SINGULAIR) 10 MG tablet Take 1 tablet (10 mg total) by mouth at bedtime. Patient not taking: Reported on 09/10/2017 01/22/15   Dessa PhiFunches, Josalyn, MD  omeprazole (PRILOSEC) 20 MG capsule Take 1 capsule (20 mg total) by mouth 2 (two) times daily before a meal. Patient not taking: Reported on 06/12/2017 05/11/17   Lizbeth BarkHairston, Mandesia R, FNP  sucralfate (CARAFATE) 1 g tablet Take 1 tablet (1 g total) by mouth 4 (four) times daily -  with meals and at bedtime. Patient not taking: Reported on 06/12/2017 04/21/17   Roxy HorsemanBrowning, Robert, PA-C    Family History Family History  Problem Relation Age of Onset  . Colon cancer Neg Hx   . Esophageal cancer Neg Hx   . Stomach cancer Neg Hx     Social History Social History   Tobacco Use  . Smoking status: Light Tobacco Smoker  . Smokeless tobacco: Never Used  . Tobacco comment: smokes occasionally   Substance Use Topics  . Alcohol use: No  . Drug use: No     Allergies   Patient has no known allergies.   Review of Systems Review of Systems  Constitutional: Negative for fever.  Musculoskeletal: Positive for neck pain.  Neurological: Negative for numbness and headaches.       Physical Exam Updated Vital Signs BP (!) 114/94 (BP Location: Right Arm)   Pulse 69   Temp (!) 97.5 F (36.4 C) (Oral)   Resp 18   Ht 6' (1.829 m)   Wt 78 kg (171 lb 15.3 oz)   SpO2 99%   BMI 23.32 kg/m   Physical Exam  Constitutional: He is oriented to person, place, and time. He appears well-developed and well-nourished. No distress.  HENT:  Head: Normocephalic and atraumatic.  Eyes: Conjunctivae are normal.  Neck: Normal range of motion. Neck supple. No JVD present. No tracheal deviation present. No thyromegaly present.  Mild tenderness to left paracervical spinal region with decreased left rotational neck movement but normal flexion and extension.  Tenderness along left trapezius muscle and left posterior shoulder.  Cardiovascular: Normal rate, regular rhythm and intact distal pulses.  Pulmonary/Chest: Effort normal and breath sounds normal.  Musculoskeletal:  5 out of 5 strength to bilateral upper extremities  Lymphadenopathy:    He has no cervical adenopathy.  Neurological: He is alert and oriented to person, place, and time. No cranial nerve deficit or sensory deficit. GCS eye subscore is 4. GCS verbal subscore is 5. GCS motor subscore is 6.  Skin: No rash noted.  Psychiatric: He has a normal mood and affect.  Nursing note and vitals reviewed.    ED Treatments / Results  Labs (all labs ordered are listed, but only abnormal results are displayed) Labs Reviewed - No data to display  EKG None  Radiology Dg Cervical Spine Complete  Result Date: 10/21/2017 CLINICAL DATA:  Left-sided neck pain radiating to left shoulder. EXAM: CERVICAL SPINE - COMPLETE 4+ VIEW COMPARISON:  None. FINDINGS: No evidence of fracture, subluxation or prevertebral soft tissue swelling. Mild disc space narrowing at C4-5 with minimal posterior osteophyte formation. Mild endplate osteophyte formation at C7-T1. No significant bony foraminal stenosis identified on oblique views. No bony  lesions. IMPRESSION: Minimal degenerative disc disease at C4-5 and C7-T1. No acute findings. Electronically Signed   By: Kyle Lack M.D.   On: 10/21/2017 09:16  X-ray reviewed by me  Procedures Procedures (including critical care time)  Medications Ordered in ED Medications - No data to display   Initial Impression / Assessment and Plan / ED Course  I have reviewed the triage vital signs and the nursing notes.  Pertinent labs & imaging results that were available during my care of the patient were reviewed by me and considered in my medical decision making (see chart for details).     BP 112/78   Pulse 62   Temp (!) 97.5 F (36.4 C) (Oral)   Resp 18   Ht 6' (1.829 m)   Wt 78 kg (171 lb 15.3 oz)   SpO2 98%   BMI 23.32 kg/m    Final Clinical Impressions(s) / ED  Diagnoses   Final diagnoses:  Torticollis    ED Discharge Orders        Ordered    cyclobenzaprine (FLEXERIL) 10 MG tablet  2 times daily PRN     10/21/17 0944    ibuprofen (ADVIL,MOTRIN) 600 MG tablet  Every 6 hours PRN     10/21/17 0944    predniSONE (DELTASONE) 20 MG tablet     10/21/17 0944     8:41 AM Patient complaining of pain to the left side of his neck for the past 3 weeks.  Symptoms suggestive of torticollis.  Symptom is not suggestive of infectious etiology.  I have low suspicion for dissection such as vertebral dissection.  Will perform screening x-ray of his cervical spine.  9:42 AM Cervical spine x-ray demonstrate minimal degenerative disc disease at C4-C5 and C7/T1 without any acute finding.  There is mild endplate osteophyte formation in these area.  I suspect the osteophyte may have caused some irritation of the tendons and muscle leading to his symptoms.  At this time I would like to provide patient with a short course of steroid, muscle relaxant and anti-inflammatory medication for symptomatic treatment.  Orthopedic referral given as needed.  Return precautions discussed.  Patient voiced  understanding and agrees with plan.   Fayrene Helper, PA-C 10/21/17 0945    Tegeler, Canary Brim, MD 10/21/17 [phone]

## 2017-10-21 NOTE — ED Notes (Signed)
Patient transported to X-ray 

## 2017-10-21 NOTE — ED Triage Notes (Addendum)
Neck pain on left for a month and radiates at times into back and head. Reports when he is palpating on neck he sometimes hears popping and feels zing. Denies injuries or falls. He states this has happened in past but only last few days. No nuchal rigidity. Denies medical hx

## 2017-10-24 MED FILL — CYCLOBENZAPRINE 10 MG TAB: 10 | 10 days supply | Qty: 20 | Fill #0

## 2017-10-24 MED FILL — IBUPROFEN 600 MG TABLET: 600 | 8 days supply | Qty: 30 | Fill #0

## 2017-10-24 MED FILL — predniSONE 20 MG TABS: 20 | 4 days supply | Qty: 8 | Fill #0

## 2017-11-15 NOTE — Progress Notes (Deleted)
Patient ID: Kyle SessionsSia F Hijazi, male   DOB: 01/08/1971, 47 y.o.   MRN: [id number]  Seen in ED 10/21/2017 for cervical strain/neck pain.   From ED note: Cervical spine x-ray demonstrate minimal degenerative disc disease at C4-C5 and C7/T1 without any acute finding.  There is mild endplate osteophyte formation in these area.  I suspect the osteophyte may have caused some irritation of the tendons and muscle leading to his symptoms.  At this time I would like to provide patient with a short course of steroid, muscle relaxant and anti-inflammatory medication for symptomatic treatment.  Orthopedic referral given as needed.  Return precautions discussed.  Patient voiced understanding and agrees with plan.

## 2017-11-16 ENCOUNTER — Inpatient Hospital Stay: Payer: Medicaid Other

## 2017-11-28 ENCOUNTER — Encounter: Payer: Self-pay | Admitting: Pharmacist

## 2017-11-28 ENCOUNTER — Ambulatory Visit: Payer: Self-pay | Attending: Family Medicine | Admitting: Pharmacist

## 2017-11-28 DIAGNOSIS — Z23 Encounter for immunization: Secondary | ICD-10-CM | POA: Insufficient documentation

## 2017-11-28 NOTE — Progress Notes (Signed)
Patient presents to clinic for Influenza vaccine administration. Has completed pre-visit questionnaire which includes documented consent. No contraindications exist. Vaccine administered. Patient with no reactions to vaccine upon discharge.    Documentation entered in to [facility] under "immunizations" tab.   Luke Van Ausdall, PharmD, CPP Clinical Pharmacist [facility] [phone]  

## 2017-12-07 ENCOUNTER — Ambulatory Visit: Payer: Self-pay | Attending: Internal Medicine | Admitting: Internal Medicine

## 2017-12-07 ENCOUNTER — Encounter: Payer: Self-pay | Admitting: Internal Medicine

## 2017-12-07 VITALS — BP 110/69 | HR 79 | Temp 98.3°F | Resp 16 | Wt 168.4 lb

## 2017-12-07 DIAGNOSIS — M47812 Spondylosis without myelopathy or radiculopathy, cervical region: Secondary | ICD-10-CM | POA: Insufficient documentation

## 2017-12-07 DIAGNOSIS — M542 Cervicalgia: Secondary | ICD-10-CM

## 2017-12-07 DIAGNOSIS — F17210 Nicotine dependence, cigarettes, uncomplicated: Secondary | ICD-10-CM | POA: Insufficient documentation

## 2017-12-07 DIAGNOSIS — Z79899 Other long term (current) drug therapy: Secondary | ICD-10-CM | POA: Insufficient documentation

## 2017-12-07 DIAGNOSIS — Z9889 Other specified postprocedural states: Secondary | ICD-10-CM | POA: Insufficient documentation

## 2017-12-07 DIAGNOSIS — Z791 Long term (current) use of non-steroidal anti-inflammatories (NSAID): Secondary | ICD-10-CM | POA: Insufficient documentation

## 2017-12-07 MED ORDER — DICLOFENAC SODIUM 1 % TD GEL: 2.0000 g | Freq: Four times a day (QID) | TRANSDERMAL | 1 refills | Status: DC

## 2017-12-07 MED FILL — DICLOFENAC SODIUM 1% GEL: 1 | 12 days supply | Qty: 100 | Fill #0

## 2017-12-07 NOTE — Progress Notes (Signed)
Patient ID: Kyle Herrera, male    DOB: 1971/01/05  MRN: [id number]  CC: Hospitalization Follow-up (ED f/u)   Subjective: Kyle Herrera is a 47 y.o. male who presents for ER f/u His concerns today include:   Pt seen in ER 10/21/2017 for LT sided neck pain.  Pain was radiating up the neck and down to the shoulder blade.  Patient states that the neck pain started after he slept under an air conditioning vent.  X-ray of cervical spine done in the emergency room revealed some osteoarthritis changes.  Patient was given a course of prednisone, ibuprofen and Flexeril. -Today he reports that his pain is 70% better.  Still little pain when he turns the head to the LT side.  No numbness or tingling.   Patient Active Problem List   Diagnosis Date Noted  . Alopecia areata 11/16/2015  . Environmental allergies 01/22/2015     Current Outpatient Medications on File Prior to Visit  Medication Sig Dispense Refill  . albuterol (PROVENTIL HFA;VENTOLIN HFA) 108 (90 BASE) MCG/ACT inhaler Inhale 1-2 puffs into the lungs every 6 (six) hours as needed for wheezing or shortness of breath. (Patient not taking: Reported on 06/12/2017) 8 g 3  . Biotin w/ Vitamins C & E (HAIR SKIN & NAILS GUMMIES) 1250-7.5-7.5 MCG-MG-UNT CHEW Chew 1 tablet by mouth daily.    Marland Kitchen ibuprofen (ADVIL,MOTRIN) 600 MG tablet Take 1 tablet (600 mg total) by mouth every 6 (six) hours as needed for moderate pain. 30 tablet 0  . loratadine (CLARITIN) 10 MG tablet Take 1 tablet (10 mg total) by mouth daily. 30 tablet 2   No current facility-administered medications on file prior to visit.     No Known Allergies  Social History   Socioeconomic History  . Marital status: Married    Spouse name: Not on file  . Number of children: 2  . Years of education: Not on file  . Highest education level: Not on file  Occupational History  . Occupation: Designer, jewellery   Social Needs  . Financial resource strain: Not on file  . Food insecurity:    Worry:  Not on file    Inability: Not on file  . Transportation needs:    Medical: Not on file    Non-medical: Not on file  Tobacco Use  . Smoking status: Light Tobacco Smoker  . Smokeless tobacco: Never Used  . Tobacco comment: smokes occasionally   Substance and Sexual Activity  . Alcohol use: No  . Drug use: No  . Sexual activity: Not on file  Lifestyle  . Physical activity:    Days per week: Not on file    Minutes per session: Not on file  . Stress: Not on file  Relationships  . Social connections:    Talks on phone: Not on file    Gets together: Not on file    Attends religious service: Not on file    Active member of club or organization: Not on file    Attends meetings of clubs or organizations: Not on file    Relationship status: Not on file  . Intimate partner violence:    Fear of current or ex partner: Not on file    Emotionally abused: Not on file    Physically abused: Not on file    Forced sexual activity: Not on file  Other Topics Concern  . Not on file  Social History Narrative   Moved from [place] in 2013  Family History  Problem Relation Age of Onset  . Colon cancer Neg Hx   . Esophageal cancer Neg Hx   . Stomach cancer Neg Hx     Past Surgical History:  Procedure Laterality Date  . TONSILLECTOMY      ROS: Review of Systems Negative except as above   PHYSICAL EXAM: BP 110/69   Pulse 79   Temp 98.3 F (36.8 C) (Oral)   Resp 16   Wt 168 lb 6.4 oz (76.4 kg)   SpO2 98%   BMI 22.84 kg/m   Physical Exam  General appearance - alert, well appearing, and in no distress Mental status - normal mood, behavior, speech, dress, motor activity, and thought processes Musculoskeletal -neck: No point tenderness on palpation of the cervical spine of the trapezius muscle.  He has good passive range of motion  ASSESSMENT AND PLAN: 1. Acute neck pain 2. Spondylosis of cervical region without myelopathy or radiculopathy -Recommend good positioning on pillow at  night to avoid neck strain.  Voltaren gel given to use as needed.  Patient was given the opportunity to ask questions.  Patient verbalized understanding of the plan and was able to repeat key elements of the plan.   No orders of the defined types were placed in this encounter.    Requested Prescriptions   Signed Prescriptions Disp Refills  . diclofenac sodium (VOLTAREN) 1 % GEL 100 g 1    Sig: Apply 2 g topically 4 (four) times daily.    Return if symptoms worsen or fail to improve.  Jonah Blueeborah Sid Greener, MD, FACP

## 2017-12-07 NOTE — Progress Notes (Signed)
Pt states he is not having any pain today just some discomfort in his neck

## 2017-12-12 ENCOUNTER — Emergency Department (HOSPITAL_COMMUNITY)
Admission: EM | Admit: 2017-12-12 | Discharge: 2017-12-12 | Disposition: A | Discharge status: Home / Self Care | Payer: Self-pay | Attending: Emergency Medicine | Admitting: Emergency Medicine

## 2017-12-12 ENCOUNTER — Other Ambulatory Visit: Payer: Self-pay

## 2017-12-12 ENCOUNTER — Encounter (HOSPITAL_COMMUNITY): Payer: Self-pay | Admitting: *Deleted

## 2017-12-12 ENCOUNTER — Emergency Department (HOSPITAL_COMMUNITY): Payer: Self-pay

## 2017-12-12 DIAGNOSIS — Z79899 Other long term (current) drug therapy: Secondary | ICD-10-CM | POA: Insufficient documentation

## 2017-12-12 DIAGNOSIS — J3089 Other allergic rhinitis: Secondary | ICD-10-CM | POA: Insufficient documentation

## 2017-12-12 DIAGNOSIS — F17210 Nicotine dependence, cigarettes, uncomplicated: Secondary | ICD-10-CM | POA: Insufficient documentation

## 2017-12-12 MED ORDER — LORATADINE-PSEUDOEPHEDRINE ER 5-120 MG PO TB12: 1.0000 | ORAL_TABLET | Freq: Two times a day (BID) | ORAL | 2 refills | Status: DC

## 2017-12-12 MED ORDER — NAPHAZOLINE-PHENIRAMINE 0.025-0.3 % OP SOLN: 1.0000 [drp] | OPHTHALMIC | 1 refills | Status: DC | PRN

## 2017-12-12 MED ORDER — ACETAMINOPHEN 500 MG PO TABS: 1000.0000 mg | ORAL_TABLET | Freq: Four times a day (QID) | ORAL | 0 refills | Status: DC | PRN

## 2017-12-12 MED ORDER — FLUTICASONE PROPIONATE 50 MCG/ACT NA SUSP: 2.0000 | Freq: Every day | NASAL | 0 refills | Status: DC

## 2017-12-12 MED FILL — FLUTICASONE PROP 50 MCG SPR: 50 | 30 days supply | Qty: 16 | Fill #0

## 2017-12-12 NOTE — ED Triage Notes (Signed)
Pt reports recent cough, cold, headache, sob. Had recent pneumonia. Denies cough or fever.

## 2017-12-12 NOTE — ED Provider Notes (Signed)
[facility] Provider [facility]   CSN: [id number] Arrival date & time: 12/12/17  0524     History   Chief Complaint Chief Complaint  Patient presents with  . Cough  . Shortness of Breath    HPI Kyle Herrera is a 47 y.o. male.  HPI Patient reports runny itchy nose, itchy eyes with clear drainage and frontal headache behind the eyes.  No fever.  No sore throat.  No earache.  No cough.  Patient reports he does have history of chronic allergies but this seems different and worse.  He did try some Claritin 2 days ago without relief of symptoms.  Patient is concerned because 2 years ago he developed pneumonia.  He works at [employer] and does not have exposure to industrial dusts or pollutants.  He lives in a con[place]ium setting in town. Past Medical History:  Diagnosis Date  . Allergy   . H. pylori infection     Patient Active Problem List   Diagnosis Date Noted  . Alopecia areata 11/16/2015  . Environmental allergies 01/22/2015    Past Surgical History:  Procedure Laterality Date  . TONSILLECTOMY          Home Medications    Prior to Admission medications   Medication Sig Start Date End Date Taking? Authorizing Provider  acetaminophen (TYLENOL) 500 MG tablet Take 2 tablets (1,000 mg total) by mouth every 6 (six) hours as needed for headache. 12/12/17   Arby BarrettePfeiffer, Denita Lun, MD  albuterol (PROVENTIL HFA;VENTOLIN HFA) 108 (90 BASE) MCG/ACT inhaler Inhale 1-2 puffs into the lungs every 6 (six) hours as needed for wheezing or shortness of breath. Patient not taking: Reported on 06/12/2017 01/22/15   Dessa PhiFunches, Josalyn, MD  Biotin w/ Vitamins C & E (HAIR SKIN & NAILS GUMMIES) 1250-7.5-7.5 MCG-MG-UNT CHEW Chew 1 tablet by mouth daily.    [provider]  diclofenac sodium (VOLTAREN) 1 % GEL Apply 2 g topically 4 (four) times daily. 12/07/17   Marcine MatarJohnson, Deborah B, MD  fluticasone (FLONASE) 50 MCG/ACT nasal spray Place 2 sprays into both nostrils  daily. 12/12/17   Arby BarrettePfeiffer, Adaeze Better, MD  ibuprofen (ADVIL,MOTRIN) 600 MG tablet Take 1 tablet (600 mg total) by mouth every 6 (six) hours as needed for moderate pain. 10/21/17   Fayrene Helperran, Bowie, PA-C  loratadine (CLARITIN) 10 MG tablet Take 1 tablet (10 mg total) by mouth daily. 09/12/17   Marcine MatarJohnson, Deborah B, MD  loratadine-pseudoephedrine (CLARITIN-D 12 HOUR) 5-120 MG tablet Take 1 tablet by mouth 2 (two) times daily. 12/12/17   Arby BarrettePfeiffer, Amed Datta, MD  naphazoline-pheniramine (NAPHCON-A) 0.025-0.3 % ophthalmic solution Place 1 drop into both eyes every 4 (four) hours as needed for eye irritation. 12/12/17   Arby BarrettePfeiffer, Maronda Caison, MD    Family History Family History  Problem Relation Age of Onset  . Colon cancer Neg Hx   . Esophageal cancer Neg Hx   . Stomach cancer Neg Hx     Social History Social History   Tobacco Use  . Smoking status: Light Tobacco Smoker  . Smokeless tobacco: Never Used  . Tobacco comment: smokes occasionally   Substance Use Topics  . Alcohol use: No  . Drug use: No     Allergies   Patient has no known allergies.   Review of Systems Review of Systems Constitutional: No fever no chills no general malaise Aspiratory: No cough, no shortness of breath. GI: No abdominal pain no nausea no vomiting  Physical Exam Updated Vital Signs BP 111/73  Pulse 67   Temp (!) 97.5 F (36.4 C) (Oral)   Resp 16   SpO2 100%   Physical Exam  Constitutional: He is oriented to person, place, and time. He appears well-developed and well-nourished. No distress.  HENT:  Bilateral TMs tympanosclerosis but otherwise normal.  Oral cavity pink and moist.  Dentition good condition.  Posterior oropharynx widely patent without postnasal drip.  Bilateral nares patent without significant drainage or bogginess.  Eyes: Pupils are equal, round, and reactive to light. EOM are normal.  Neck: Neck supple.  Cardiovascular: Normal rate, regular rhythm and normal heart sounds.  Pulmonary/Chest: Effort  normal and breath sounds normal.  Abdominal: Soft. He exhibits no distension. There is no tenderness. There is no guarding.  Musculoskeletal: Normal range of motion.  Neurological: He is alert and oriented to person, place, and time. He exhibits normal muscle tone. Coordination normal.  Skin: Skin is warm and dry.  Psychiatric: He has a normal mood and affect.     ED Treatments / Results  Labs (all labs ordered are listed, but only abnormal results are displayed) Labs Reviewed - No data to display  EKG None  Radiology Dg Chest 2 View  Result Date: 12/12/2017 CLINICAL DATA:  Cough and cold for 3 weeks off and on. Previous history of pneumonia. Shortness of breath. EXAM: CHEST - 2 VIEW COMPARISON:  09/10/2017 FINDINGS: Hyperinflation with central interstitial pattern and bronchial wall thickening suggesting chronic bronchitis. No airspace disease or consolidation in the lungs. No blunting of costophrenic angles. No pneumothorax. Mediastinal contours appear intact. IMPRESSION: Chronic bronchitic changes. No evidence of active pulmonary disease. Electronically Signed   By: Burman Nieves M.D.   On: 12/12/2017 05:49    Procedures Procedures (including critical care time)  Medications Ordered in ED Medications - No data to display   Initial Impression / Assessment and Plan / ED Course  I have reviewed the triage vital signs and the nursing notes.  Pertinent labs & imaging results that were available during my care of the patient were reviewed by me and considered in my medical decision making (see chart for details).      Final Clinical Impressions(s) / ED Diagnoses   Final diagnoses:  Non-seasonal allergic rhinitis, unspecified trigger   Patient is clinically well appearance.  ENT exam normal except tympanosclerosis.  Symptoms most suggestive of environmental allergies.  Patient has only tried Claritin a couple of days ago, will have him try Flonase, Claritin-D, Naphcon-A and  acetaminophen as needed.  Patient is counseled on return symptoms for fever, cough, shortness of breath or other concerning symptoms. ED Discharge Orders         Ordered    fluticasone (FLONASE) 50 MCG/ACT nasal spray  Daily     12/12/17 0718    loratadine-pseudoephedrine (CLARITIN-D 12 HOUR) 5-120 MG tablet  2 times daily     12/12/17 0718    naphazoline-pheniramine (NAPHCON-A) 0.025-0.3 % ophthalmic solution  Every 4 hours PRN     12/12/17 0718    acetaminophen (TYLENOL) 500 MG tablet  Every 6 hours PRN     12/12/17 6962           Arby Barrette, MD 12/12/17 [phone]

## 2018-02-27 ENCOUNTER — Encounter: Payer: Self-pay | Admitting: Internal Medicine

## 2018-02-27 ENCOUNTER — Ambulatory Visit: Payer: Self-pay | Attending: Internal Medicine | Admitting: Internal Medicine

## 2018-02-27 VITALS — BP 103/52 | HR 64 | Temp 98.1°F | Resp 16 | Wt 162.8 lb

## 2018-02-27 DIAGNOSIS — K0889 Other specified disorders of teeth and supporting structures: Secondary | ICD-10-CM | POA: Insufficient documentation

## 2018-02-27 DIAGNOSIS — F17210 Nicotine dependence, cigarettes, uncomplicated: Secondary | ICD-10-CM | POA: Insufficient documentation

## 2018-02-27 DIAGNOSIS — K5900 Constipation, unspecified: Secondary | ICD-10-CM | POA: Insufficient documentation

## 2018-02-27 DIAGNOSIS — E785 Hyperlipidemia, unspecified: Secondary | ICD-10-CM | POA: Insufficient documentation

## 2018-02-27 DIAGNOSIS — Z79899 Other long term (current) drug therapy: Secondary | ICD-10-CM | POA: Insufficient documentation

## 2018-02-27 DIAGNOSIS — Z9109 Other allergy status, other than to drugs and biological substances: Secondary | ICD-10-CM

## 2018-02-27 DIAGNOSIS — M25519 Pain in unspecified shoulder: Secondary | ICD-10-CM | POA: Insufficient documentation

## 2018-02-27 MED ORDER — ALBUTEROL SULFATE HFA 108 (90 BASE) MCG/ACT IN AERS: 1.0000 | INHALATION_SPRAY | Freq: Four times a day (QID) | RESPIRATORY_TRACT | 3 refills | Status: DC | PRN

## 2018-02-27 MED FILL — ALBUTEROL SULFATE HFA 108 (: 108 (90 BAS | 18 days supply | Qty: 18 | Fill #0

## 2018-02-27 NOTE — Progress Notes (Signed)
Patient ID: Grayce SessionsSia F Rubis, male    DOB: Mar 24, 1971  MRN: [id number]  CC: Shoulder Pain and Dental Pain   Subjective: Jacquez Louellen MolderJaff is a 47 y.o. male who presents for UC visit His concerns today include:   Pt wanting recheck on cholesterol.  Thinks his LDL cholesterol of 112 causes him to be constipated and gets [facility]. Does well with eating habits.  Eat meat occasionally.  Drinks mainly water.  Occasionally drinks soda.    C/o pain in one of his teeth on LT side when he chews at times.   No swelling.   C/o intermittent problems with allergies.  Request RF on Proventil inhaler to use as needed.  When allergies are bad, he has problem breathing.  Denies chronic cough.  No wheezing at nights. Patient Active Problem List   Diagnosis Date Noted  . Alopecia areata 11/16/2015  . Environmental allergies 01/22/2015     Current Outpatient Medications on File Prior to Visit  Medication Sig Dispense Refill  . acetaminophen (TYLENOL) 500 MG tablet Take 2 tablets (1,000 mg total) by mouth every 6 (six) hours as needed for headache. 30 tablet 0  . Biotin w/ Vitamins C & E (HAIR SKIN & NAILS GUMMIES) 1250-7.5-7.5 MCG-MG-UNT CHEW Chew 1 tablet by mouth daily.    . diclofenac sodium (VOLTAREN) 1 % GEL Apply 2 g topically 4 (four) times daily. 100 g 1  . fluticasone (FLONASE) 50 MCG/ACT nasal spray Place 2 sprays into both nostrils daily. 16 g 0  . ibuprofen (ADVIL,MOTRIN) 600 MG tablet Take 1 tablet (600 mg total) by mouth every 6 (six) hours as needed for moderate pain. 30 tablet 0  . loratadine (CLARITIN) 10 MG tablet Take 1 tablet (10 mg total) by mouth daily. 30 tablet 2  . loratadine-pseudoephedrine (CLARITIN-D 12 HOUR) 5-120 MG tablet Take 1 tablet by mouth 2 (two) times daily. 30 tablet 2  . naphazoline-pheniramine (NAPHCON-A) 0.025-0.3 % ophthalmic solution Place 1 drop into both eyes every 4 (four) hours as needed for eye irritation. 5 mL 1   No current  facility-administered medications on file prior to visit.     No Known Allergies  Social History   Socioeconomic History  . Marital status: Married    Spouse name: Not on file  . Number of children: 2  . Years of education: Not on file  . Highest education level: Not on file  Occupational History  . Occupation: Designer, [employer]   Social Needs  . Financial resource strain: Not on file  . Food insecurity:    Worry: Not on file    Inability: Not on file  . Transportation needs:    Medical: Not on file    Non-medical: Not on file  Tobacco Use  . Smoking status: Light Tobacco Smoker  . Smokeless tobacco: Never Used  . Tobacco comment: smokes occasionally   Substance and Sexual Activity  . Alcohol use: No  . Drug use: No  . Sexual activity: Not on file  Lifestyle  . Physical activity:    Days per week: Not on file    Minutes per session: Not on file  . Stress: Not on file  Relationships  . Social connections:    Talks on phone: Not on file    Gets together: Not on file    Attends religious service: Not on file    Active member of club or organization: Not on file  Attends meetings of clubs or organizations: Not on file    Relationship status: Not on file  . Intimate partner violence:    Fear of current or ex partner: Not on file    Emotionally abused: Not on file    Physically abused: Not on file    Forced sexual activity: Not on file  Other Topics Concern  . Not on file  Social History Narrative   Moved from [place] in 2013     Family History  Problem Relation Age of Onset  . Colon cancer Neg Hx   . Esophageal cancer Neg Hx   . Stomach cancer Neg Hx     Past Surgical History:  Procedure Laterality Date  . TONSILLECTOMY      ROS: Review of Systems Negative except as above.    PHYSICAL EXAM: BP (!) 103/52   Pulse 64   Temp 98.1 F (36.7 C) (Oral)   Resp 16   Wt 162 lb 12.8 oz (73.8 kg)   SpO2 98%   BMI 22.08 kg/m   Physical Exam  General  appearance - alert, well appearing, and in no distress Mental status - normal mood, behavior, speech, dress, motor activity, and thought processes Nose - normal and patent, no erythema, discharge or polyps Mouth - mucous membranes moist, pharynx normal without lesions .  No inflammation of the gums.  No obvious cavities noted.  Patient has several fillings that are still in place.   Neck - supple, no significant adenopathy Chest - clear to auscultation, no wheezes, rales or rhonchi, symmetric air entry Heart - normal rate, regular rhythm, normal S1, S2, no murmurs, rubs, clicks or gallops   ASSESSMENT AND PLAN: 1. Hyperlipidemia, unspecified hyperlipidemia type Advised patient that LDL cholesterol of 112 would not cause constipation or headache.  No need to recheck cholesterol so soon.  Encourage him to continue healthy eating habits and regular exercise.  However the patient insists stating that he is worried about it. - Lipid panel  2. Tooth pain with chewing Patient given information of dental resources in the area.  3. Environmental allergies - albuterol (PROVENTIL HFA;VENTOLIN HFA) 108 (90 Base) MCG/ACT inhaler; Inhale 1-2 puffs into the lungs every 6 (six) hours as needed for wheezing or shortness of breath.  Dispense: 8 g; Refill: 3    Patient was given the opportunity to ask questions.  Patient verbalized understanding of the plan and was able to repeat key elements of the plan.   Orders Placed This Encounter  Procedures  . Lipid panel     Requested Prescriptions   Signed Prescriptions Disp Refills  . albuterol (PROVENTIL HFA;VENTOLIN HFA) 108 (90 Base) MCG/ACT inhaler 8 g 3    Sig: Inhale 1-2 puffs into the lungs every 6 (six) hours as needed for wheezing or shortness of breath.    Return if symptoms worsen or fail to improve.  Jonah Blue, MD, FACP

## 2018-02-28 LAB — LIPID PANEL
CHOL/HDL RATIO: 3.5 ratio (ref 0.0–5.0)
CHOLESTEROL TOTAL: 157 mg/dL (ref 100–199)
HDL: 45 mg/dL (ref >39)
LDL Calculated: 94 mg/dL (ref 0–99)
Triglycerides: 91 mg/dL (ref 0–149)
VLDL Cholesterol Cal: 18 mg/dL (ref 5–40)

## 2018-03-02 ENCOUNTER — Ambulatory Visit: Payer: Self-pay | Attending: Internal Medicine

## 2018-03-02 ENCOUNTER — Telehealth: Payer: Self-pay

## 2018-03-02 NOTE — Telephone Encounter (Signed)
Contacted pt to go over lab results pt didn't answer left a detailed vm informing pt of results and if he has any questions or concerns to give me a call  If pt calls back please give results: his total and LDL cholesterol are nl. LDL 94 with goal being less than 100.

## 2018-03-09 ENCOUNTER — Ambulatory Visit: Payer: Self-pay | Attending: Internal Medicine

## 2018-04-02 ENCOUNTER — Ambulatory Visit: Payer: Medicaid Other | Admitting: Internal Medicine

## 2018-05-24 ENCOUNTER — Encounter (HOSPITAL_COMMUNITY): Payer: Self-pay | Admitting: Emergency Medicine

## 2018-05-24 ENCOUNTER — Ambulatory Visit (HOSPITAL_COMMUNITY)
Admission: EM | Admit: 2018-05-24 | Discharge: 2018-05-24 | Disposition: A | Discharge status: Home / Self Care | Payer: Self-pay | Attending: Emergency Medicine | Admitting: Emergency Medicine

## 2018-05-24 ENCOUNTER — Other Ambulatory Visit: Payer: Self-pay

## 2018-05-24 DIAGNOSIS — J029 Acute pharyngitis, unspecified: Secondary | ICD-10-CM

## 2018-05-24 LAB — POCT RAPID STREP A: STREPTOCOCCUS, GROUP A SCREEN (DIRECT): NEGATIVE

## 2018-05-24 MED ORDER — IBUPROFEN 600 MG PO TABS: 600.0000 mg | ORAL_TABLET | Freq: Four times a day (QID) | ORAL | 0 refills | Status: DC | PRN

## 2018-05-24 MED ORDER — FLUTICASONE PROPIONATE 50 MCG/ACT NA SUSP: 2.0000 | Freq: Every day | NASAL | 0 refills | Status: DC

## 2018-05-24 NOTE — ED Triage Notes (Signed)
PT reports sore throat and cough with no fever. Started sunday

## 2018-05-24 NOTE — Discharge Instructions (Addendum)
your rapid strep was negative today, so we have sent off a throat culture.  We will contact you and call in the appropriate antibiotics if your culture comes back positive for an infection requiring antibiotic treatment.  Give us a working phone number. 1 gram of Tylenol and 600 mg ibuprofen together 3-4 times a day as needed for pain.  Make sure you drink plenty of extra fluids.  Some people find salt water gargles and  Traditional Medicinal's "Throat Coat" tea helpful. Take 5 mL of liquid Benadryl and 5 mL of Maalox. Mix it together, and then hold it in your mouth for as long as you can and then swallow. You may do this 4 times a day.   ° °Go to www.goodrx.com to look up your medications. This will give you a list of where you can find your prescriptions at the most affordable prices. Or ask the pharmacist what the cash price is, or if they have any other discount programs available to help make your medication more affordable. This can be less expensive than what you would pay with insurance.   °

## 2018-05-24 NOTE — ED Provider Notes (Signed)
HPI  SUBJECTIVE:  Patient reports sore throat starting 5 days ago after drinking some orange juice. Sx worse with swallowing.  Sx better with Tylenol, Chloraseptic spray.  No Fever  No neck stiffness  + Cough at night, postnasal drip.  No nasal congestion, rhinorrhea. + Myalgias + Headache No Rash     No Recent Strep Exposure No Abdominal Pain No reflux sxs No Allergy sxs  No Breathing difficulty, voice changes, sensation of throat swelling shut No Drooling No Trismus No abx in past month.  No antipyretic in past 4-6 hrs He has a past medical history of allergies.  They are not bothering him now he is status post tonsillectomy.  No history of GERD, frequent strep, mono, diabetes. PMD: [facility].   Past Medical History:  Diagnosis Date  . Allergy   . H. pylori infection     Past Surgical History:  Procedure Laterality Date  . TONSILLECTOMY      Family History  Problem Relation Age of Onset  . Colon cancer Neg Hx   . Esophageal cancer Neg Hx   . Stomach cancer Neg Hx     Social History   Tobacco Use  . Smoking status: Light Tobacco Smoker  . Smokeless tobacco: Never Used  . Tobacco comment: smokes occasionally   Substance Use Topics  . Alcohol use: No  . Drug use: No    No current facility-administered medications for this encounter.   Current Outpatient Medications:  .  albuterol (PROVENTIL HFA;VENTOLIN HFA) 108 (90 Base) MCG/ACT inhaler, Inhale 1-2 puffs into the lungs every 6 (six) hours as needed for wheezing or shortness of breath., Disp: 8 g, Rfl: 3 .  Biotin w/ Vitamins C & E (HAIR SKIN & NAILS GUMMIES) 1250-7.5-7.5 MCG-MG-UNT CHEW, Chew 1 tablet by mouth daily., Disp: , Rfl:  .  fluticasone (FLONASE) 50 MCG/ACT nasal spray, Place 2 sprays into both nostrils daily., Disp: 16 g, Rfl: 0 .  ibuprofen (ADVIL,MOTRIN) 600 MG tablet, Take 1 tablet (600 mg total) by mouth every 6 (six) hours as needed., Disp: 30 tablet, Rfl: 0 .   loratadine-pseudoephedrine (CLARITIN-D 12 HOUR) 5-120 MG tablet, Take 1 tablet by mouth 2 (two) times daily., Disp: 30 tablet, Rfl: 2 .  naphazoline-pheniramine (NAPHCON-A) 0.025-0.3 % ophthalmic solution, Place 1 drop into both eyes every 4 (four) hours as needed for eye irritation., Disp: 5 mL, Rfl: 1  No Known Allergies   ROS  As noted in HPI.   Physical Exam  BP 114/73 (BP Location: Left Arm)   Pulse 69   Temp 98.1 F (36.7 C) (Oral)   Resp 18   SpO2 99%   Constitutional: Well developed, well nourished, no acute distress Eyes:  EOMI, conjunctiva normal bilaterally HENT: Normocephalic, atraumatic,mucus membranes moist. +  nasal congestion + erythematous oropharynx .  tonsils surgically absent.  Uvula midline. Extensive PND Respiratory: Normal inspiratory effort Cardiovascular: Normal rate, no murmurs, rubs, gallops GI: nondistended, nontender. No appreciable splenomegaly skin: No rash, skin intact Lymph: - Anterior cervical LN.  No posterior cervical lymphadenopathy Musculoskeletal: no deformities Neurologic: Alert & oriented x 3, no focal neuro deficits Psychiatric: Speech and behavior appropriate.   ED Course   Medications - No data to display  Orders Placed This Encounter  Procedures  . POCT rapid strep A [facility])    Standing Status:   Standing    Number of Occurrences:   1    Results for orders placed or performed during  the hospital encounter of 05/24/18 (from the past 24 hour(s))  POCT rapid strep A [facility])     Status: None   Collection Time: 05/24/18 12:57 PM  Result Value Ref Range   Streptococcus, Group A Screen (Direct) NEGATIVE NEGATIVE   No results found.  ED Clinical Impression  Pharyngitis, unspecified etiology   ED Assessment/Plan   Presentation consistent with a viral pharyngitis.  Rapid strep negative. Obtaining throat culture to guide antibiotic treatment. Discussed this with patient We'll contact them if culture is  positive, and will call in Appropriate antibiotics. Patient home with Flonase, ibuprofen, Tylenol, Benadryl/Maalox mixture. Patient to followup with PMD when necessary.  Discussed labs,  MDM, plan and followup with patient. Discussed sn/sx that should prompt return to the ED. patient agrees with plan.   Meds ordered this encounter  Medications  . fluticasone (FLONASE) 50 MCG/ACT nasal spray    Sig: Place 2 sprays into both nostrils daily.    Dispense:  16 g    Refill:  0  . ibuprofen (ADVIL,MOTRIN) 600 MG tablet    Sig: Take 1 tablet (600 mg total) by mouth every 6 (six) hours as needed.    Dispense:  30 tablet    Refill:  0     *This clinic note was created using Scientist, clinical (histocompatibility and immunogenetics). Therefore, there may be occasional mistakes despite careful proofreading.    Domenick Gong, MD 05/25/18 [phone]

## 2018-08-27 ENCOUNTER — Other Ambulatory Visit: Payer: Self-pay | Admitting: Pharmacist

## 2018-08-27 ENCOUNTER — Telehealth: Payer: Self-pay | Admitting: Internal Medicine

## 2018-08-27 DIAGNOSIS — Z9109 Other allergy status, other than to drugs and biological substances: Secondary | ICD-10-CM

## 2018-08-27 MED ORDER — ALBUTEROL SULFATE HFA 108 (90 BASE) MCG/ACT IN AERS: 2.0000 | INHALATION_SPRAY | RESPIRATORY_TRACT | 0 refills | Status: DC | PRN

## 2018-08-27 MED FILL — PROAIR HFA 90 MCG INHALER: 108 (90 BAS | 16 days supply | Qty: 9 | Fill #0

## 2018-09-10 ENCOUNTER — Telehealth: Payer: Self-pay | Admitting: Internal Medicine

## 2018-09-10 NOTE — Telephone Encounter (Signed)
Patient called stating he would like to speak to you. Please follow up.

## 2018-09-10 NOTE — Telephone Encounter (Signed)
I called Pt since he call me, to informed him that e and his wife has an app with financial dept a TELE visit on 09/19/2018 at 3::30 for both of them

## 2018-09-12 IMAGING — DX DG CERVICAL SPINE COMPLETE 4+V
6 series · 6 of 6 positions shown · non-contrast
Comparison: None.

CLINICAL DATA: Left-sided neck pain radiating to left shoulder.

EXAM:
CERVICAL SPINE - COMPLETE 4+ VIEW

[c-spine lat]
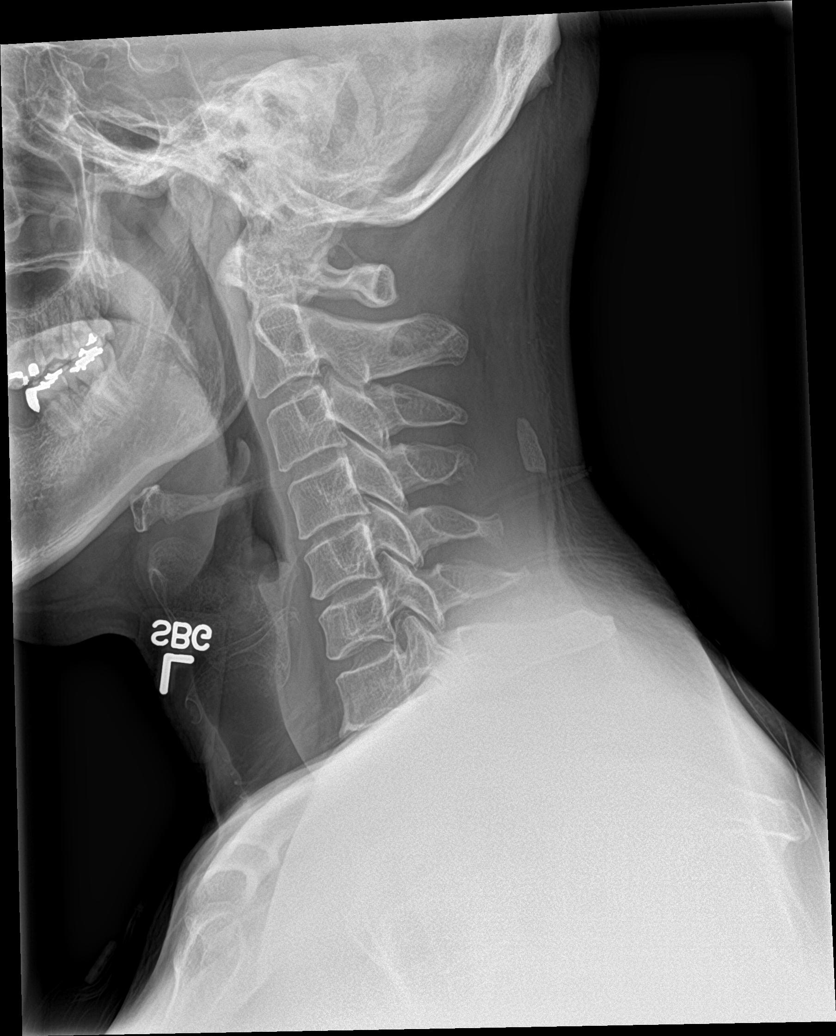

[c-spine obl (1 of 2)]
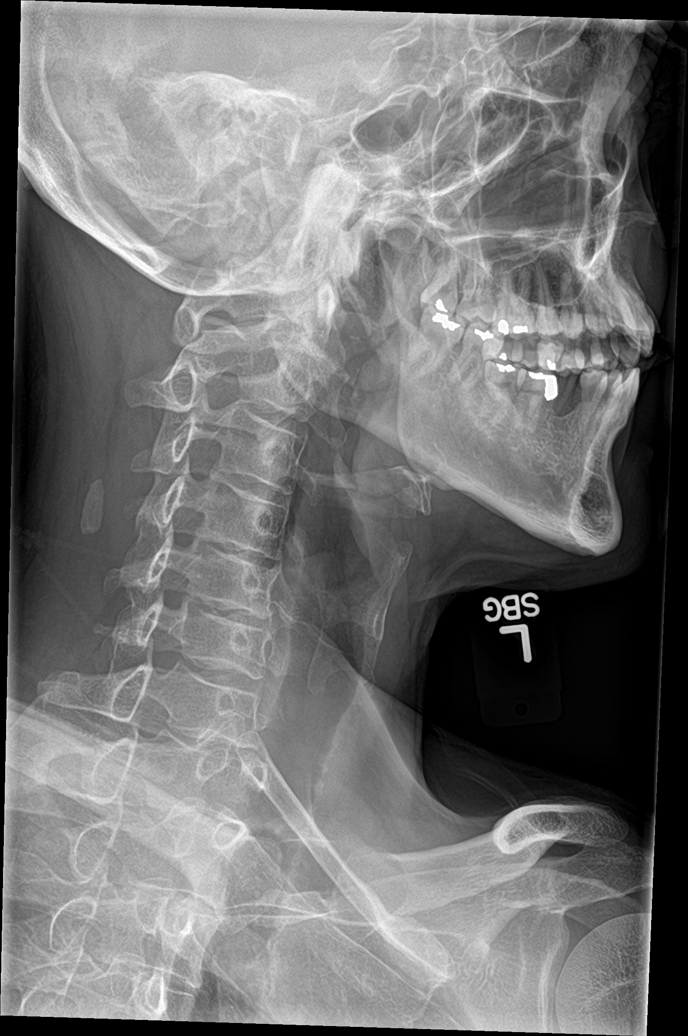

[c-spine obl (2 of 2)]
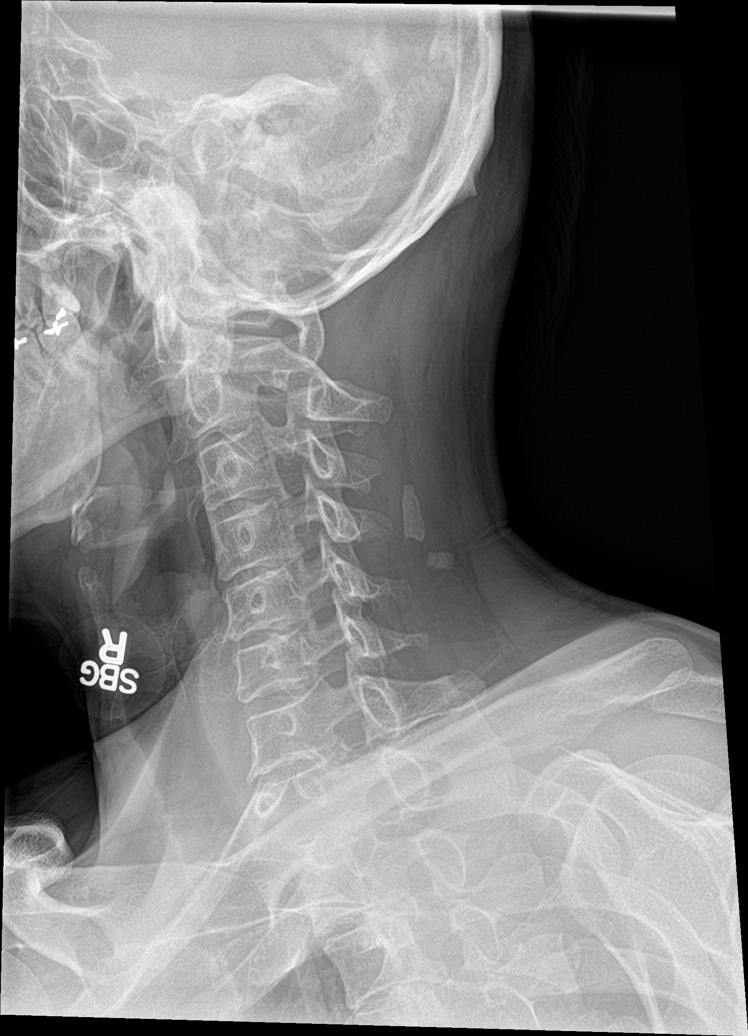

[c-spine ap]
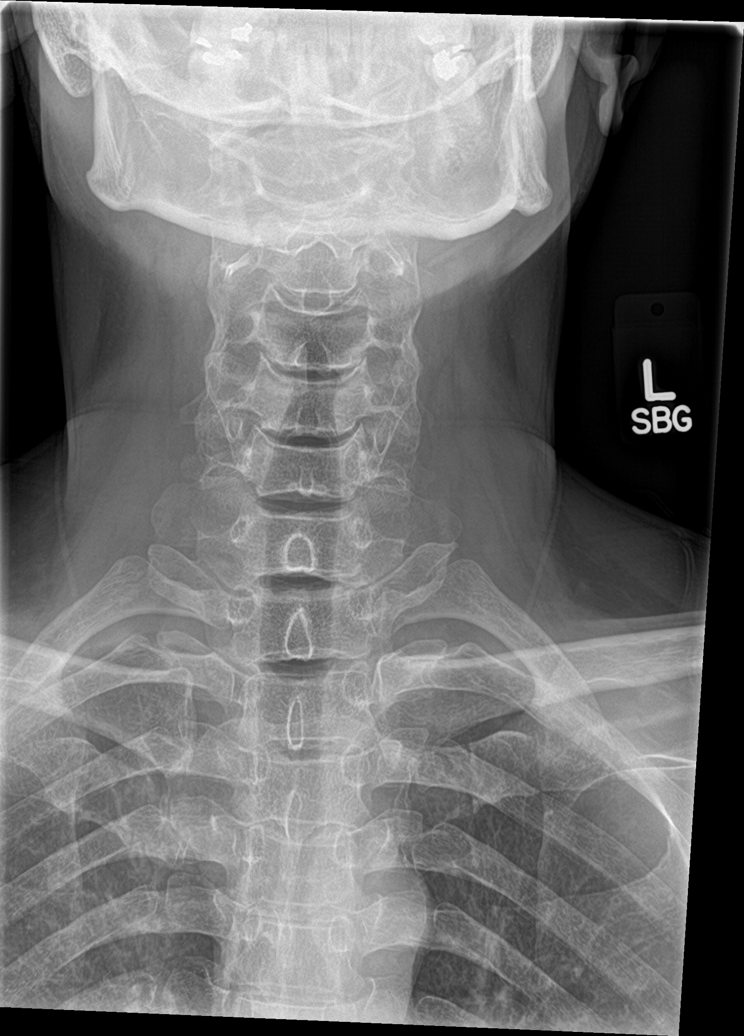

[c-spine swimmers]
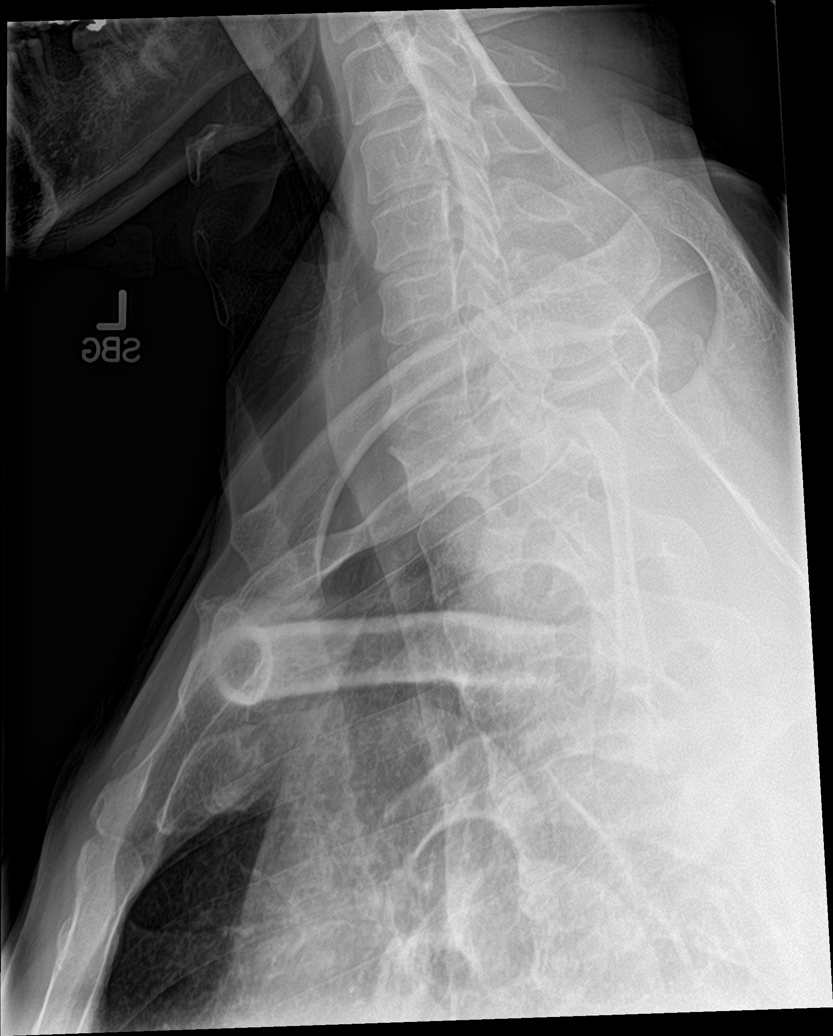

[c-spine open mouth]
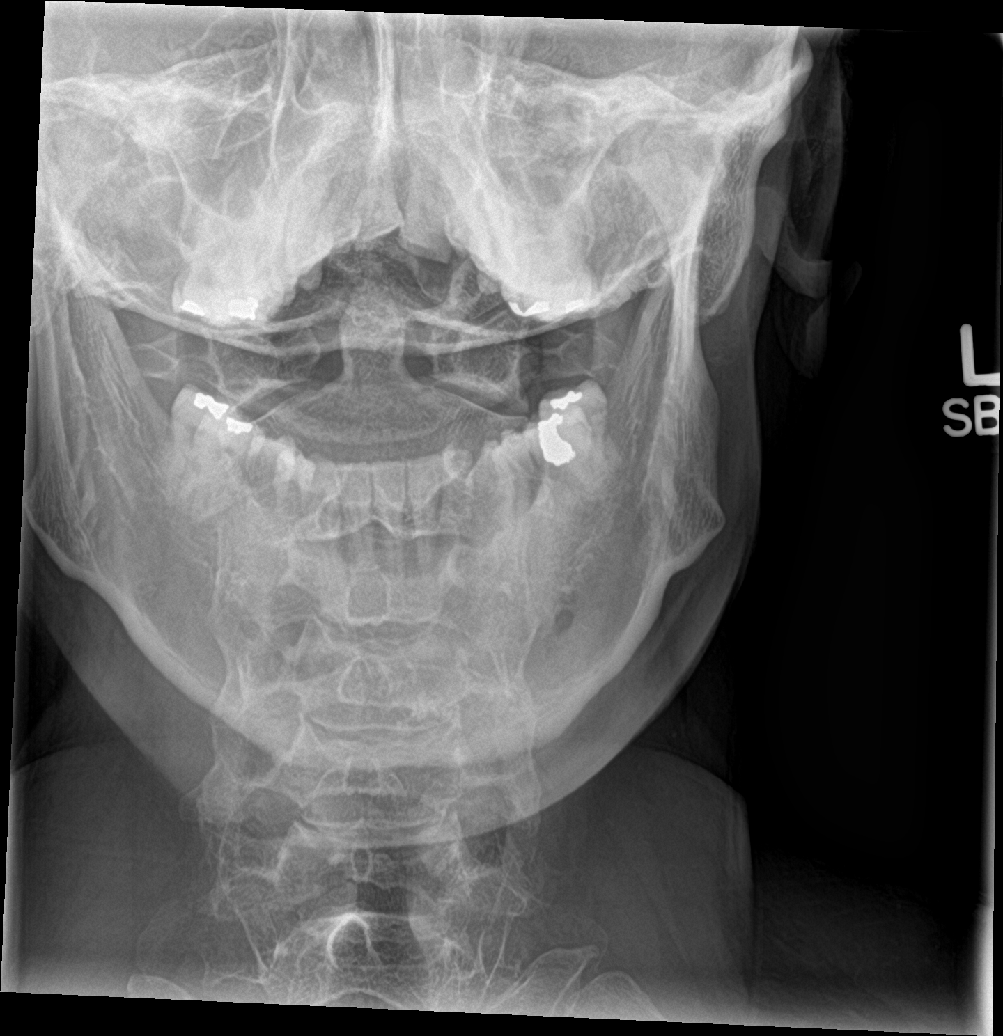

[6 of 6 positions shown; findings below may reference images not displayed]

FINDINGS: No evidence of fracture, subluxation or prevertebral soft tissue
swelling. Mild disc space narrowing at C4-5 with minimal posterior
osteophyte formation. Mild endplate osteophyte formation at C7-T1.
No significant bony foraminal stenosis identified on oblique views.
No bony lesions.
IMPRESSION: Minimal degenerative disc disease at C4-5 and C7-T1. No acute
findings.

## 2018-09-19 ENCOUNTER — Other Ambulatory Visit: Payer: Self-pay

## 2018-09-19 ENCOUNTER — Ambulatory Visit: Payer: Self-pay | Attending: Internal Medicine

## 2018-09-27 ENCOUNTER — Encounter: Payer: Self-pay | Admitting: Internal Medicine

## 2018-09-27 ENCOUNTER — Ambulatory Visit: Payer: Self-pay | Attending: Internal Medicine | Admitting: Internal Medicine

## 2018-09-27 ENCOUNTER — Other Ambulatory Visit: Payer: Self-pay

## 2018-09-27 DIAGNOSIS — Z9109 Other allergy status, other than to drugs and biological substances: Secondary | ICD-10-CM

## 2018-09-27 MED ORDER — ALBUTEROL SULFATE HFA 108 (90 BASE) MCG/ACT IN AERS: 2.0000 | INHALATION_SPRAY | RESPIRATORY_TRACT | 4 refills | Status: DC | PRN

## 2018-09-27 MED ORDER — LORATADINE 10 MG PO TABS: 10.0000 mg | ORAL_TABLET | Freq: Every day | ORAL | 5 refills | Status: DC

## 2018-09-27 NOTE — Progress Notes (Signed)
Virtual Visit via Telephone Note Due to current restrictions/limitations of in-office visits due to the COVID-19 pandemic, this scheduled clinical appointment was converted to a telehealth visit  I connected with Kyle Herrera on 09/27/18 at 4:10 p.m EDT by telephone and verified that I am speaking with the correct person using two identifiers. I am in my office.  The patient is at home.  Only the patient and myself participated in this encounter.  I discussed the limitations, risks, security and privacy concerns of performing an evaluation and management service by telephone and the availability of in person appointments. I also discussed with the patient that there may be a patient responsible charge related to this service. The patient expressed understanding and agreed to proceed.   History of Present Illness:  C/o intermittent problems with allergies.  Request RF on Proventil inhaler to use as needed.  When allergies are bad, he has problem breathing.  Worse this time of year.  Denies chronic cough.  No wheezing at nights. Came to pharmacy 3 wks sgo   Wanting Vit D and cholesterol check done.  Patient had lipid profile done twice last year.  Last one was done in December and levels were normal.  His last vitamin D level was done in February of last year and was 41.8.  Observations/Objective: No direct observation done as this was a telephone encounter.  Lab Results  Component Value Date   CHOL 157 02/27/2018   HDL 45 02/27/2018   LDLCALC 94 02/27/2018   TRIG 91 02/27/2018   CHOLHDL 3.5 02/27/2018    Assessment and Plan: 1. Environmental allergies Refill given on albuterol inhaler and Claritin.  In regards to his request for check of cholesterol and vitamin D, I told patient that he is not due for a cholesterol check as he had one less than a year ago with good levels.  Also we do not need to recheck vitamin D level at this time.  We can recheck both at the end of this  year   Follow Up Instructions:    I discussed the assessment and treatment plan with the patient. The patient was provided an opportunity to ask questions and all were answered. The patient agreed with the plan and demonstrated an understanding of the instructions.   The patient was advised to call back or seek an in-person evaluation if the symptoms worsen or if the condition fails to improve as anticipated.  I provided 4 minutes of non-face-to-face time during this encounter.   Karle Plumber, MD

## 2018-09-27 NOTE — Progress Notes (Signed)
Patient verified DOB Patient has taken medication today. Patient has eaten. Patient complains of neck pain when he sleeps. Patient is needing his inhaler more this time during the year and has itchy eyes with redness and drainage. Patient would like cholesterol medication.

## 2018-10-01 MED FILL — ALBUTEROL SULFATE HFA 108 (: 108 (90 BAS | 16 days supply | Qty: 18 | Fill #0

## 2018-10-01 MED FILL — ?LORATADINE 10 MG TABS: 10 | 30 days supply | Qty: 30 | Fill #0

## 2018-11-03 IMAGING — DX DG CHEST 2V
2 series · 2 of 2 positions shown · non-contrast
Comparison: 09/10/2017

CLINICAL DATA: Cough and cold for 3 weeks off and on. Previous
history of pneumonia. Shortness of breath.

EXAM:
CHEST - 2 VIEW

[chest pa]
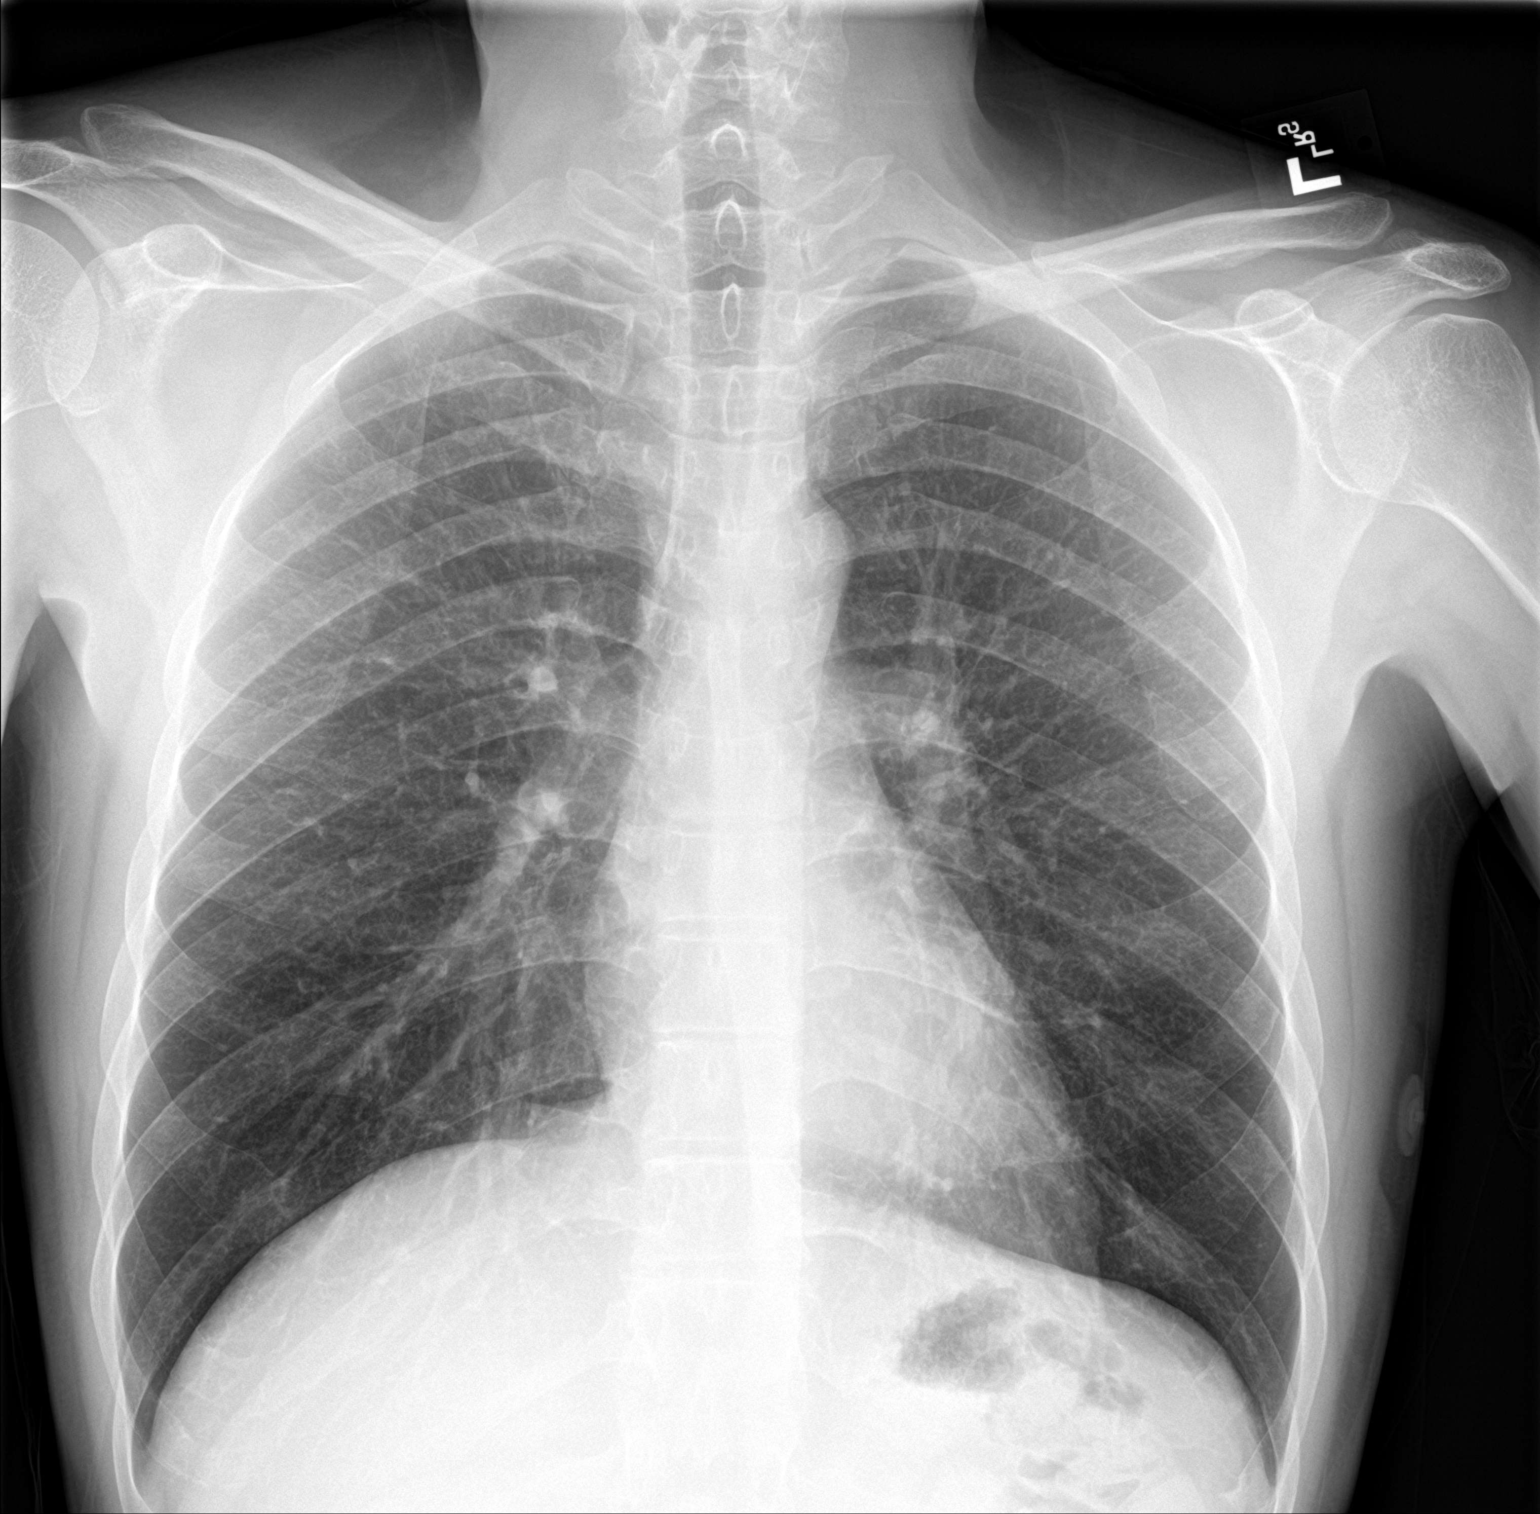

[chest lat]
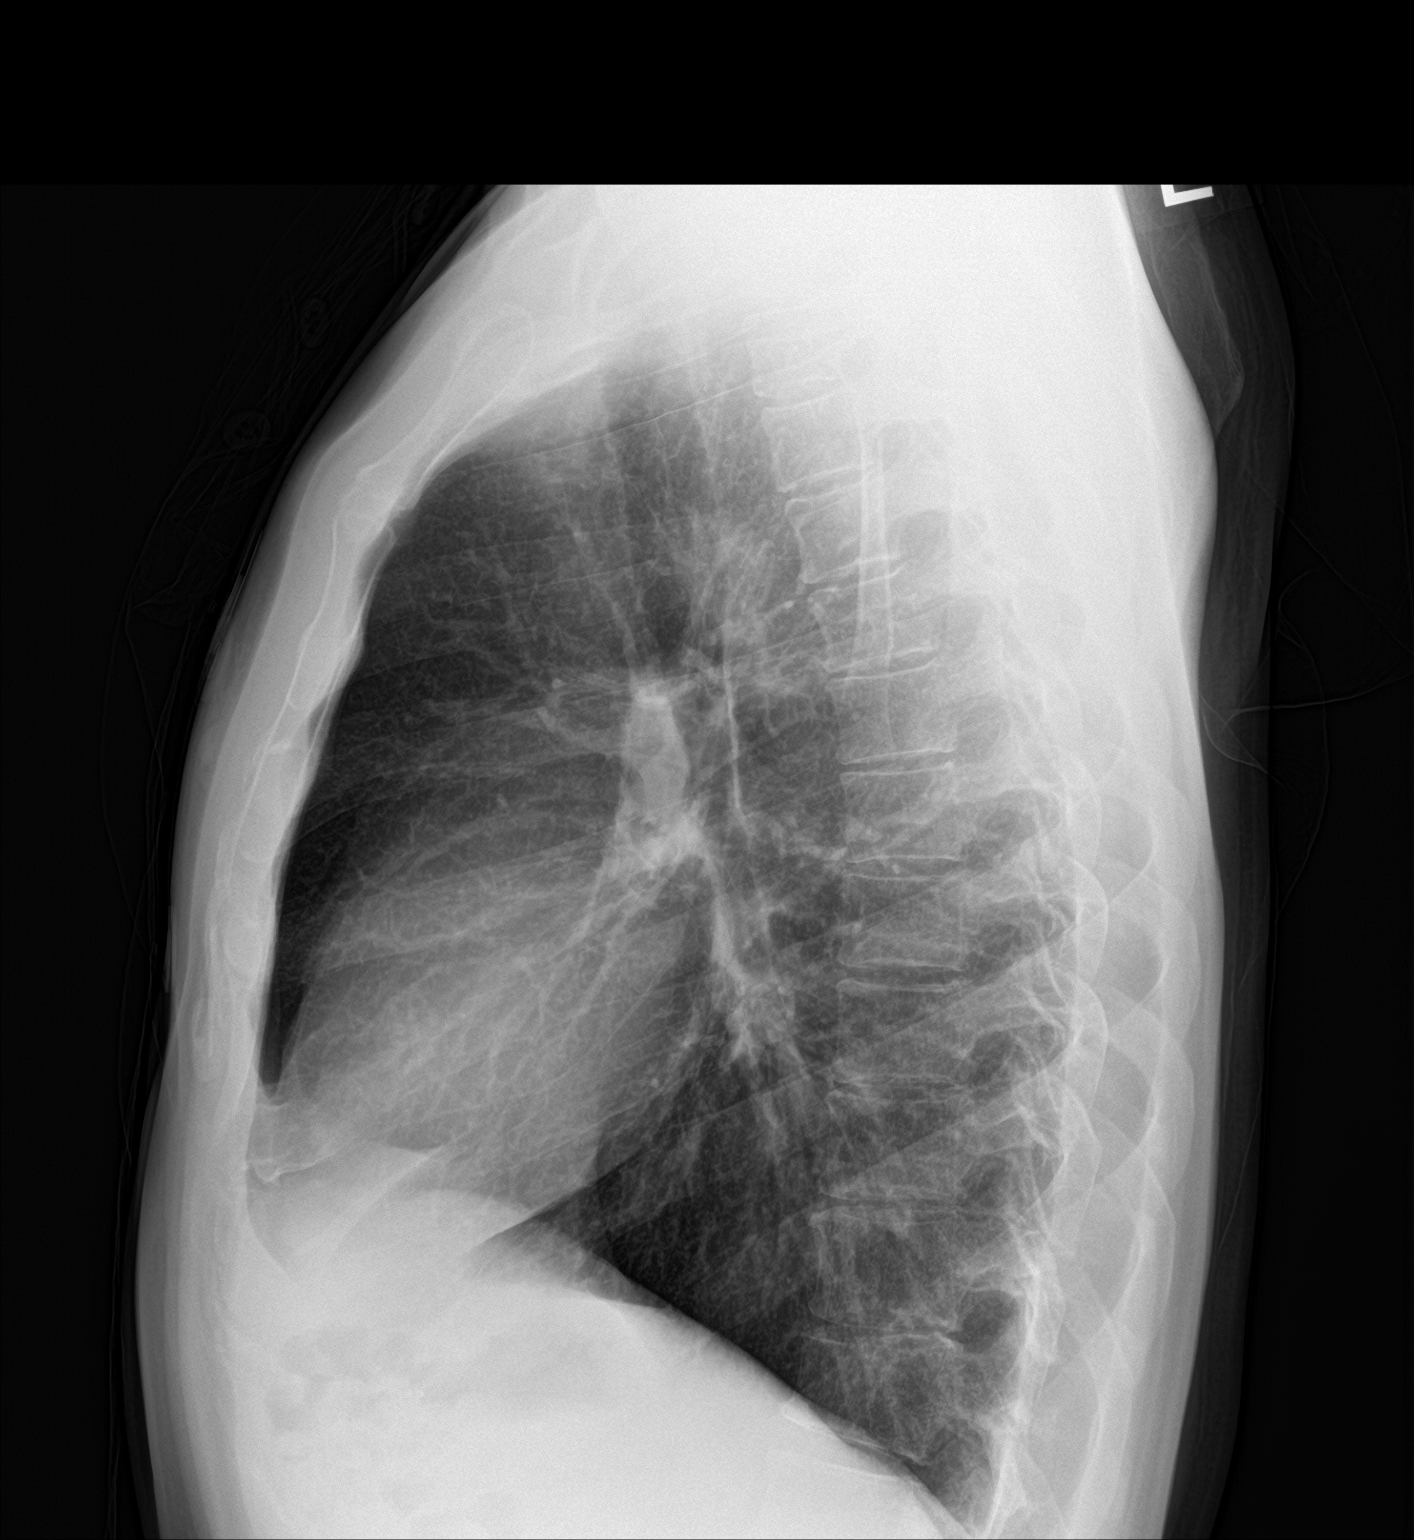

[2 of 2 positions shown; findings below may reference images not displayed]

FINDINGS: Hyperinflation with central interstitial pattern and bronchial wall
thickening suggesting chronic bronchitis. No airspace disease or
consolidation in the lungs. No blunting of costophrenic angles. No
pneumothorax. Mediastinal contours appear intact.
IMPRESSION: Chronic bronchitic changes. No evidence of active pulmonary disease.

## 2018-12-06 ENCOUNTER — Ambulatory Visit: Payer: Medicaid Other | Attending: Internal Medicine | Admitting: Pharmacist

## 2018-12-06 ENCOUNTER — Other Ambulatory Visit: Payer: Self-pay

## 2018-12-06 DIAGNOSIS — Z23 Encounter for immunization: Secondary | ICD-10-CM

## 2018-12-06 NOTE — Progress Notes (Signed)
Patient presents for vaccination against influennza per orders of Dr. Wynetta Emery. Consent given. Counseling provided. No contraindications exists. Vaccine administered without incident.

## 2019-04-15 ENCOUNTER — Other Ambulatory Visit: Payer: Self-pay

## 2019-04-15 ENCOUNTER — Ambulatory Visit: Payer: Medicaid Other

## 2019-04-25 ENCOUNTER — Telehealth: Payer: Self-pay | Admitting: *Deleted

## 2019-04-25 ENCOUNTER — Other Ambulatory Visit: Payer: Self-pay

## 2019-04-25 ENCOUNTER — Emergency Department (HOSPITAL_COMMUNITY)
Admission: EM | Admit: 2019-04-25 | Discharge: 2019-04-25 | Disposition: A | Discharge status: Home / Self Care | Payer: Medicaid Other | Attending: Emergency Medicine | Admitting: Emergency Medicine

## 2019-04-25 ENCOUNTER — Encounter (HOSPITAL_COMMUNITY): Payer: Self-pay | Admitting: Emergency Medicine

## 2019-04-25 DIAGNOSIS — K0889 Other specified disorders of teeth and supporting structures: Secondary | ICD-10-CM | POA: Diagnosis present

## 2019-04-25 DIAGNOSIS — F17200 Nicotine dependence, unspecified, uncomplicated: Secondary | ICD-10-CM | POA: Insufficient documentation

## 2019-04-25 DIAGNOSIS — Z79899 Other long term (current) drug therapy: Secondary | ICD-10-CM | POA: Insufficient documentation

## 2019-04-25 MED ORDER — OXYCODONE-ACETAMINOPHEN 5-325 MG PO TABS: 1.0000 | ORAL_TABLET | Freq: Three times a day (TID) | ORAL | 0 refills | Status: DC | PRN

## 2019-04-25 MED ORDER — AMOXICILLIN 500 MG PO CAPS: 500.0000 mg | ORAL_CAPSULE | Freq: Three times a day (TID) | ORAL | 0 refills | Status: DC

## 2019-04-25 NOTE — ED Notes (Signed)
Pt dc'd home w/all belongings, a/o x4, ambulatory on dc  

## 2019-04-25 NOTE — ED Triage Notes (Signed)
Patient reports left lower molar pain onset yesterday unrelieved by OTC pain medications .  

## 2019-04-25 NOTE — Telephone Encounter (Signed)
Pt called regarding pain medicine.  Would like to change it or take more often.  EDCM advised that he would need to be re-evaluated for an alternate/additional pain Rx.

## 2019-04-25 NOTE — Discharge Instructions (Addendum)
Follow-up with your dentist.  See if they can get you in sooner than February 22.

## 2019-04-25 NOTE — ED Provider Notes (Signed)
[facility] EMERGENCY DEPARTMENT Provider Note   CSN: [id number] Arrival date & time: 04/25/19  4010     History Chief Complaint  Patient presents with  . Dental Pain    Kyle Herrera is a 49 y.o. male.  HPI Patient presents with dental pain.  Has had for last couple days.  Left lower most posterior tooth and little bit on the left upper.  No trauma.  Has had dental problems before.  States that he has an appointment with his dentist but is not until February 22.  No fevers.  No difficulty swallowing.  Mild relief with ibuprofen at home.    Past Medical History:  Diagnosis Date  . Allergy   . H. pylori infection     Patient Active Problem List   Diagnosis Date Noted  . Hyperlipidemia 02/27/2018  . Alopecia areata 11/16/2015  . Environmental allergies 01/22/2015    Past Surgical History:  Procedure Laterality Date  . TONSILLECTOMY         Family History  Problem Relation Age of Onset  . Colon cancer Neg Hx   . Esophageal cancer Neg Hx   . Stomach cancer Neg Hx     Social History   Tobacco Use  . Smoking status: Light Tobacco Smoker  . Smokeless tobacco: Never Used  . Tobacco comment: smokes occasionally   Substance Use Topics  . Alcohol use: No  . Drug use: No    Home Medications Prior to Admission medications   Medication Sig Start Date End Date Taking? Authorizing Provider  albuterol (PROAIR HFA) 108 (90 Base) MCG/ACT inhaler Inhale 2 puffs into the lungs every 4 (four) hours as needed for wheezing or shortness of breath. 09/27/18   Ladell Pier, MD  amoxicillin (AMOXIL) 500 MG capsule Take 1 capsule (500 mg total) by mouth 3 (three) times daily. 04/25/19   Davonna Belling, MD  Biotin w/ Vitamins C & E (HAIR SKIN & NAILS GUMMIES) 1250-7.5-7.5 MCG-MG-UNT CHEW Chew 1 tablet by mouth daily.    [provider]  fluticasone (FLONASE) 50 MCG/ACT nasal spray Place 2 sprays into both nostrils daily. 05/24/18   Melynda Ripple, MD   ibuprofen (ADVIL,MOTRIN) 600 MG tablet Take 1 tablet (600 mg total) by mouth every 6 (six) hours as needed. 05/24/18   Melynda Ripple, MD  loratadine (CLARITIN) 10 MG tablet Take 1 tablet (10 mg total) by mouth daily. 09/27/18   Ladell Pier, MD  naphazoline-pheniramine (NAPHCON-A) 0.025-0.3 % ophthalmic solution Place 1 drop into both eyes every 4 (four) hours as needed for eye irritation. 12/12/17   Charlesetta Shanks, MD  oxyCODONE-acetaminophen (PERCOCET/ROXICET) 5-325 MG tablet Take 1 tablet by mouth every 8 (eight) hours as needed for severe pain. 04/25/19   Davonna Belling, MD    Allergies    Patient has no known allergies.  Review of Systems   Review of Systems  Constitutional: Negative for appetite change and fever.  HENT: Positive for dental problem.   Respiratory: Negative for shortness of breath.   Cardiovascular: Negative for chest pain.    Physical Exam Updated Vital Signs BP 118/79 (BP Location: Right Arm)   Pulse 77   Temp 97.9 F (36.6 C) (Oral)   Resp 18   Ht 6' (1.829 m)   Wt 79 kg   SpO2 99%   BMI 23.62 kg/m   Physical Exam HENT:     Head: Atraumatic.     Mouth/Throat:     Comments: Patient with  signs of previous dental work on posterior teeth.  Has no tenderness over upper or lower teeth.  No swelling of jaw or floor of mouth.  Teeth not loose. Cardiovascular:     Rate and Rhythm: Regular rhythm.  Skin:    General: Skin is warm.  Neurological:     Mental Status: He is alert. Mental status is at baseline.     ED Results / Procedures / Treatments   Labs (all labs ordered are listed, but only abnormal results are displayed) Labs Reviewed - No data to display  EKG None  Radiology No results found.  Procedures Procedures (including critical care time)  Medications Ordered in ED Medications - No data to display  ED Course  I have reviewed the triage vital signs and the nursing notes.  Pertinent labs & imaging results that were  available during my care of the patient were reviewed by me and considered in my medical decision making (see chart for details).    MDM Rules/Calculators/A&P                      Patient with dental pain.  Has dental follow-up.  Decent dentition but has had previous work.  No swelling.  Discharge home.  Treat with amoxicillin and 6 Percocet. Final Clinical Impression(s) / ED Diagnoses Final diagnoses:  Pain, dental    Rx / DC Orders ED Discharge Orders         Ordered    amoxicillin (AMOXIL) 500 MG capsule  3 times daily     04/25/19 0706    oxyCODONE-acetaminophen (PERCOCET/ROXICET) 5-325 MG tablet  Every 8 hours PRN     04/25/19 0706           Benjiman Core, MD 04/25/19 [phone]

## 2019-04-26 ENCOUNTER — Emergency Department (HOSPITAL_COMMUNITY)
Admission: EM | Admit: 2019-04-26 | Discharge: 2019-04-26 | Disposition: A | Discharge status: Home / Self Care | Payer: Medicaid Other | Attending: Emergency Medicine | Admitting: Emergency Medicine

## 2019-04-26 ENCOUNTER — Other Ambulatory Visit: Payer: Self-pay

## 2019-04-26 ENCOUNTER — Encounter (HOSPITAL_COMMUNITY): Payer: Self-pay | Admitting: Emergency Medicine

## 2019-04-26 DIAGNOSIS — K0889 Other specified disorders of teeth and supporting structures: Secondary | ICD-10-CM | POA: Insufficient documentation

## 2019-04-26 MED ORDER — KETOROLAC TROMETHAMINE 30 MG/ML IJ SOLN
30.0000 mg | Freq: Once | INTRAMUSCULAR | Status: AC
  Administered 2019-04-26: 30 mg via INTRAMUSCULAR
  Filled 2019-04-26: qty 1

## 2019-04-26 MED ORDER — BUPIVACAINE-EPINEPHRINE (PF) 0.5% -1:200000 IJ SOLN
1.8000 mL | Freq: Once | INTRAMUSCULAR | Status: AC
  Administered 2019-04-26: 05:00:00 1.8 mL
  Filled 2019-04-26: qty 1.8

## 2019-04-26 NOTE — ED Provider Notes (Signed)
[facility] EMERGENCY DEPARTMENT Provider Note   CSN: [id number] Arrival date & time: 04/26/19  0050     History Chief Complaint  Patient presents with  . Dental Pain    Dakwan F Minus is a 49 y.o. male.  The history is provided by the patient. No language interpreter was used.  Dental Pain Location:  Lower Lower teeth location:  18/LL 2nd molar Quality:  Constant Severity:  Moderate Onset quality:  Gradual Duration:  3 days Timing:  Constant Progression:  Worsening Chronicity:  New Context: not malocclusion and not trauma   Previous work-up:  Filled cavity Relieved by:  Nothing Ineffective treatments: Amoxicillin, Percocet. Associated symptoms: facial pain   Associated symptoms: no facial swelling, no fever, no gum swelling, no oral bleeding and no trismus   Risk factors: lack of dental care        Past Medical History:  Diagnosis Date  . Allergy   . H. pylori infection     Patient Active Problem List   Diagnosis Date Noted  . Hyperlipidemia 02/27/2018  . Alopecia areata 11/16/2015  . Environmental allergies 01/22/2015    Past Surgical History:  Procedure Laterality Date  . TONSILLECTOMY         Family History  Problem Relation Age of Onset  . Colon cancer Neg Hx   . Esophageal cancer Neg Hx   . Stomach cancer Neg Hx     Social History   Tobacco Use  . Smoking status: Light Tobacco Smoker  . Smokeless tobacco: Never Used  . Tobacco comment: smokes occasionally   Substance Use Topics  . Alcohol use: No  . Drug use: No    Home Medications Prior to Admission medications   Medication Sig Start Date End Date Taking? Authorizing Provider  albuterol (PROAIR HFA) 108 (90 Base) MCG/ACT inhaler Inhale 2 puffs into the lungs every 4 (four) hours as needed for wheezing or shortness of breath. 09/27/18   Marcine Matar, MD  amoxicillin (AMOXIL) 500 MG capsule Take 1 capsule (500 mg total) by mouth 3 (three) times daily. 04/25/19    Benjiman Core, MD  Biotin w/ Vitamins C & E (HAIR SKIN & NAILS GUMMIES) 1250-7.5-7.5 MCG-MG-UNT CHEW Chew 1 tablet by mouth daily.    [provider]  fluticasone (FLONASE) 50 MCG/ACT nasal spray Place 2 sprays into both nostrils daily. 05/24/18   Domenick Gong, MD  ibuprofen (ADVIL,MOTRIN) 600 MG tablet Take 1 tablet (600 mg total) by mouth every 6 (six) hours as needed. 05/24/18   Domenick Gong, MD  loratadine (CLARITIN) 10 MG tablet Take 1 tablet (10 mg total) by mouth daily. 09/27/18   Marcine Matar, MD  naphazoline-pheniramine (NAPHCON-A) 0.025-0.3 % ophthalmic solution Place 1 drop into both eyes every 4 (four) hours as needed for eye irritation. 12/12/17   Arby Barrette, MD  oxyCODONE-acetaminophen (PERCOCET/ROXICET) 5-325 MG tablet Take 1 tablet by mouth every 8 (eight) hours as needed for severe pain. 04/25/19   Benjiman Core, MD    Allergies    Patient has no known allergies.  Review of Systems   Review of Systems  Constitutional: Negative for fever.  HENT: Positive for dental problem. Negative for facial swelling.   Ten systems reviewed and are negative for acute change, except as noted in the HPI.    Physical Exam Updated Vital Signs BP 117/76 (BP Location: Left Arm)   Pulse 63   Temp (!) 97.5 F (36.4 C) (Oral)   Resp 18  SpO2 97%   Physical Exam Vitals and nursing note reviewed.  Constitutional:      General: He is not in acute distress.    Appearance: He is well-developed. He is not diaphoretic.     Comments: Nontoxic appearing and in NAD  HENT:     Head: Normocephalic and atraumatic.     Comments: No facial swelling    Mouth/Throat:     Comments: TTP to the left lower 2nd molar; previous filling. No gingival swelling or abscess. Soft oral floor. No trismus or malocclusion. Normal phonation. Eyes:     General: No scleral icterus.    Conjunctiva/sclera: Conjunctivae normal.  Pulmonary:     Effort: Pulmonary effort is normal. No  respiratory distress.     Comments: Respirations even and unlabored Musculoskeletal:        General: Normal range of motion.     Cervical back: Normal range of motion.  Skin:    General: Skin is warm and dry.     Coloration: Skin is not pale.     Findings: No erythema or rash.  Neurological:     Mental Status: He is alert and oriented to person, place, and time.  Psychiatric:        Behavior: Behavior normal.     ED Results / Procedures / Treatments   Labs (all labs ordered are listed, but only abnormal results are displayed) Labs Reviewed - No data to display  EKG None  Radiology No results found.  Procedures Dental Block  Date/Time: 04/26/2019 4:36 AM Performed by: Antonietta Breach, PA-C Authorized by: Antonietta Breach, PA-C   Consent:    Consent obtained:  Verbal and emergent situation   Consent given by:  Patient   Risks discussed:  Infection, swelling, pain and unsuccessful block   Alternatives discussed:  No treatment Indications:    Indications: dental pain   Location:    Block type:  Supraperiosteal   Supraperiosteal location:  Lower teeth   Lower teeth location:  18/LL 2nd molar Procedure details (see MAR for exact dosages):    Needle gauge:  25 G   Anesthetic injected:  Bupivacaine 0.5% WITH epi   Injection procedure:  Anatomic landmarks identified, introduced needle, anatomic landmarks palpated, negative aspiration for blood and incremental injection Post-procedure details:    Outcome:  Pain improved   Patient tolerance of procedure:  Tolerated well, no immediate complications   (including critical care time)  Medications Ordered in ED Medications  bupivacaine-epinephrine (MARCAINE W/ EPI) 0.5% -1:200000 injection 1.8 mL (1.8 mLs Infiltration Given by Other 04/26/19 0938)    ED Course  I have reviewed the triage vital signs and the nursing notes.  Pertinent labs & imaging results that were available during my care of the patient were reviewed by me and  considered in my medical decision making (see chart for details).    MDM Rules/Calculators/A&P                      Patient with toothache.  No gross abscess.  Exam unconcerning for Ludwig's angina or spread of infection.  Patient advised to continue Amoxicillin and pain medicine.  Urged patient to follow-up with dentist.  Return precautions discussed and provided. Patient discharged in stable condition with no unaddressed concerns.   Final Clinical Impression(s) / ED Diagnoses Final diagnoses:  Dentalgia    Rx / DC Orders ED Discharge Orders    None       Antonietta Breach, PA-C 04/26/19 1829  Zadie Rhine, MD 04/26/19 [id number]

## 2019-04-26 NOTE — Discharge Instructions (Signed)
Follow-up with a dentist.  Only a dentist can fix your problem.  We recommend that you continue to take amoxicillin as previously prescribed to treat likely underlying infection.  Take 600mg  ibuprofen every 6 hours for pain control.  You have previously been prescribed Percocet to take as needed for severe pain.  Do not drive or drink alcohol after taking this medication as it may make you drowsy and impair your judgment.  Return to the ED for any new or concerning symptoms.

## 2019-04-26 NOTE — ED Triage Notes (Signed)
Reports persistent left lower molar pain unrelieved by prescription antibiotic/pain medications prescribed here yesterday .

## 2019-06-10 ENCOUNTER — Encounter: Payer: Self-pay | Admitting: Internal Medicine

## 2019-06-10 ENCOUNTER — Ambulatory Visit: Payer: Medicaid Other | Attending: Internal Medicine | Admitting: Internal Medicine

## 2019-06-10 ENCOUNTER — Other Ambulatory Visit: Payer: Self-pay

## 2019-06-10 VITALS — BP 122/76 | HR 71 | Temp 96.8°F | Resp 16 | Wt 173.0 lb

## 2019-06-10 DIAGNOSIS — Z79899 Other long term (current) drug therapy: Secondary | ICD-10-CM | POA: Insufficient documentation

## 2019-06-10 DIAGNOSIS — F17210 Nicotine dependence, cigarettes, uncomplicated: Secondary | ICD-10-CM | POA: Insufficient documentation

## 2019-06-10 DIAGNOSIS — R438 Other disturbances of smell and taste: Secondary | ICD-10-CM

## 2019-06-10 DIAGNOSIS — E785 Hyperlipidemia, unspecified: Secondary | ICD-10-CM

## 2019-06-10 NOTE — Progress Notes (Signed)
Pt is requesting blood work for dm and cholesterol

## 2019-06-10 NOTE — Progress Notes (Signed)
Patient ID: Kyle Herrera, male    DOB: 07-20-1970  MRN: [id number]  CC: blood work   Subjective: Kyle Herrera is a 49 y.o. male who presents for UC visit His concerns today include:  Pt with hx of HL, environmental allergies  C/o of feeling his mouth always sweet x 1-2 mths.  Started around the time he was having problems with one of his tooth.  Tooth extracted 2 wks ago.  Sweet taste is worse when eating onion and garlic.  No reflux symptoms Wants to be check for DM.  No fhx of DM He brushes and floss regularly Walks 1.5 hrs every day Feels he eats healthy.  Drinks mainly water, occasional sodas. Eats a lot of fruits and veggies.  Patient Active Problem List   Diagnosis Date Noted  . Hyperlipidemia 02/27/2018  . Alopecia areata 11/16/2015  . Environmental allergies 01/22/2015     Current Outpatient Medications on File Prior to Visit  Medication Sig Dispense Refill  . albuterol (PROAIR HFA) 108 (90 Base) MCG/ACT inhaler Inhale 2 puffs into the lungs every 4 (four) hours as needed for wheezing or shortness of breath. (Patient not taking: Reported on 06/10/2019) 8.5 g 4  . amoxicillin (AMOXIL) 500 MG capsule Take 1 capsule (500 mg total) by mouth 3 (three) times daily. (Patient not taking: Reported on 06/10/2019) 21 capsule 0  . Biotin w/ Vitamins C & E (HAIR SKIN & NAILS GUMMIES) 1250-7.5-7.5 MCG-MG-UNT CHEW Chew 1 tablet by mouth daily.    . fluticasone (FLONASE) 50 MCG/ACT nasal spray Place 2 sprays into both nostrils daily. (Patient not taking: Reported on 06/10/2019) 16 g 0  . ibuprofen (ADVIL,MOTRIN) 600 MG tablet Take 1 tablet (600 mg total) by mouth every 6 (six) hours as needed. (Patient not taking: Reported on 06/10/2019) 30 tablet 0  . loratadine (CLARITIN) 10 MG tablet Take 1 tablet (10 mg total) by mouth daily. (Patient not taking: Reported on 06/10/2019) 30 tablet 5  . naphazoline-pheniramine (NAPHCON-A) 0.025-0.3 % ophthalmic solution Place 1 drop into both eyes every 4 (four)  hours as needed for eye irritation. (Patient not taking: Reported on 06/10/2019) 5 mL 1  . oxyCODONE-acetaminophen (PERCOCET/ROXICET) 5-325 MG tablet Take 1 tablet by mouth every 8 (eight) hours as needed for severe pain. (Patient not taking: Reported on 06/10/2019) 6 tablet 0   No current facility-administered medications on file prior to visit.    No Known Allergies  Social History   Socioeconomic History  . Marital status: Married    Spouse name: Not on file  . Number of children: 2  . Years of education: Not on file  . Highest education level: Not on file  Occupational History  . Occupation: [employer]   Tobacco Use  . Smoking status: Light Tobacco Smoker  . Smokeless tobacco: Never Used  . Tobacco comment: smokes occasionally   Substance and Sexual Activity  . Alcohol use: No  . Drug use: No  . Sexual activity: Not on file  Other Topics Concern  . Not on file  Social History Narrative   Moved from [place] in 2013    Social Determinants of Health   Financial Resource Strain:   . Difficulty of Paying Living Expenses:   Food Insecurity:   . Worried About Programme researcher, broadcasting/film/video in the Last Year:   . Barista in the Last Year:   Transportation Needs:   . Freight forwarder (Medical):   Marland Kitchen Lack of Transportation (  Non-Medical):   Physical Activity:   . Days of Exercise per Week:   . Minutes of Exercise per Session:   Stress:   . Feeling of Stress :   Social Connections:   . Frequency of Communication with Friends and Family:   . Frequency of Social Gatherings with Friends and Family:   . Attends Religious Services:   . Active Member of Clubs or Organizations:   . Attends Banker Meetings:   Marland Kitchen Marital Status:   Intimate Partner Violence:   . Fear of Current or Ex-Partner:   . Emotionally Abused:   Marland Kitchen Physically Abused:   . Sexually Abused:     Family History  Problem Relation Age of Onset  . Colon cancer Neg Hx   . Esophageal cancer Neg Hx    . Stomach cancer Neg Hx     Past Surgical History:  Procedure Laterality Date  . TONSILLECTOMY      ROS: Review of Systems Negative except as stated above  PHYSICAL EXAM: BP 117/83   Pulse 71   Temp (!) 96.8 F (36 C)   Resp 16   Wt 173 lb (78.5 kg)   SpO2 97%   BMI 23.46 kg/m   Wt Readings from Last 3 Encounters:  06/10/19 173 lb (78.5 kg)  04/25/19 174 lb 2.6 oz (79 kg)  02/27/18 162 lb 12.8 oz (73.8 kg)    Physical Exam   General appearance - alert, well appearing, and in no distress Mental status - normal mood, behavior, speech, dress, motor activity, and thought processes Mouth - mucous membranes moist, pharynx normal without lesions.  He has mild discoloration of the teeth.  No obvious cavities seen Chest - clear to auscultation, no wheezes, rales or rhonchi, symmetric air entry Heart - normal rate, regular rhythm, normal S1, S2, no murmurs, rubs, clicks or gallops  CMP Latest Ref Rng & Units 09/10/2017 08/07/2017 05/11/2017  Glucose 65 - 99 mg/dL 98 91 74  BUN 6 - 20 mg/dL 14 12 13   Creatinine 0.61 - 1.24 mg/dL 8.65 7.84  Sodium 135 - 145 mmol/L 137 139 141  Potassium 3.5 - 5.1 mmol/L 3.8 4.6 4.5  Chloride 101 - 111 mmol/L 103 103 102  CO2 22 - 32 mmol/L 28 21 24   Calcium 8.9 - 10.3 mg/dL 9.1 9.6 9.5  Total Protein 6.0 - 8.5 g/dL - 7.1 6.9  Total Bilirubin 0.0 - 1.2 mg/dL - 0.4 0.3  Alkaline Phos 39 - 117 IU/L - 70 64  AST 0 - 40 IU/L - 20 13  ALT 0 - 44 IU/L - 19 18   Lipid Panel     Component Value Date/Time   CHOL 157 02/27/2018 1612   TRIG 91 02/27/2018 1612   HDL 45 02/27/2018 1612   CHOLHDL 3.5 02/27/2018 1612   LDLCALC 94 02/27/2018 1612    CBC    Component Value Date/Time   WBC 4.3 09/10/2017 0510   RBC 4.92 09/10/2017 0510   HGB 14.8 09/10/2017 0510   HGB 15.5 08/07/2017 1701   HCT 43.9 09/10/2017 0510   HCT 44.7 08/07/2017 1701   PLT 195 09/10/2017 0510   PLT 217 08/07/2017 1701   MCV 89.2 09/10/2017 0510   MCV 90  08/07/2017 1701   MCH 30.1 09/10/2017 0510   MCHC 33.7 09/10/2017 0510   RDW 11.9 09/10/2017 0510   RDW 13.9 08/07/2017 1701   LYMPHSABS 1.7 08/07/2017 1701   MONOABS 0.3 02/25/2015 2031  EOSABS 0.2 08/07/2017 1701   BASOSABS 0.0 08/07/2017 1701    ASSESSMENT AND PLAN: 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  Encourage patient to continue good oral hygiene including regular brushing, flossing and use of mouthwash - Hemoglobin E3X - Basic Metabolic Panel  2. Hyperlipidemia, unspecified hyperlipidemia type - Lipid panel     Patient was given the opportunity to ask questions.  Patient verbalized understanding of the plan and was able to repeat key elements of the plan.   No orders of the defined types were placed in this encounter.    Requested Prescriptions    No prescriptions requested or ordered in this encounter    No follow-ups on file.  Karle Plumber, MD, FACP

## 2019-06-10 NOTE — Patient Instructions (Signed)
Continue regular flossing and brushing of teeth. Use mouthwash at least twice a day to help maintain good oral health.

## 2019-06-11 ENCOUNTER — Telehealth: Payer: Self-pay | Admitting: Internal Medicine

## 2019-06-11 LAB — BASIC METABOLIC PANEL
BUN/Creatinine Ratio: 13 (ref 9–20)
BUN: 11 mg/dL (ref 6–24)
CO2: 24 mmol/L (ref 20–29)
Calcium: 9.6 mg/dL (ref 8.7–10.2)
Chloride: 102 mmol/L (ref 96–106)
Creatinine, Ser: 0.86 mg/dL (ref 0.76–1.27)
GFR calc Af Amer: 119 mL/min/{1.73_m2} (ref >59)
GFR calc non Af Amer: 103 mL/min/{1.73_m2} (ref >59)
Glucose: 79 mg/dL (ref 65–99)
Potassium: 4.2 mmol/L (ref 3.5–5.2)
Sodium: 140 mmol/L (ref 134–144)

## 2019-06-11 LAB — LIPID PANEL
Chol/HDL Ratio: 3 ratio (ref 0.0–5.0)
Cholesterol, Total: 165 mg/dL (ref 100–199)
HDL: 55 mg/dL (ref >39)
LDL Chol Calc (NIH): 99 mg/dL (ref 0–99)
Triglycerides: 57 mg/dL (ref 0–149)
VLDL Cholesterol Cal: 11 mg/dL (ref 5–40)

## 2019-06-11 LAB — HEMOGLOBIN A1C
Est. average glucose Bld gHb Est-mCnc: 100 mg/dL
Hgb A1c MFr Bld: 5.1 % (ref 4.8–5.6)

## 2019-06-11 NOTE — Telephone Encounter (Signed)
Patient called and requested for lab results. Please follow up at your earliest convenience.

## 2019-06-12 NOTE — Telephone Encounter (Signed)
Provider hasn't reviewed results. Once results are reviewed will contact pt

## 2019-08-28 ENCOUNTER — Ambulatory Visit: Payer: Medicaid Other | Attending: Internal Medicine

## 2019-08-28 DIAGNOSIS — Z20822 Contact with and (suspected) exposure to covid-19: Secondary | ICD-10-CM

## 2019-08-29 LAB — SARS-COV-2, NAA 2 DAY TAT

## 2019-08-29 LAB — NOVEL CORONAVIRUS, NAA: SARS-CoV-2, NAA: NOT DETECTED

## 2019-09-12 ENCOUNTER — Ambulatory Visit: Payer: Medicaid Other | Attending: Internal Medicine

## 2019-09-12 ENCOUNTER — Other Ambulatory Visit: Payer: Self-pay

## 2019-09-12 DIAGNOSIS — Z20822 Contact with and (suspected) exposure to covid-19: Secondary | ICD-10-CM

## 2019-09-13 ENCOUNTER — Other Ambulatory Visit: Payer: Medicaid Other

## 2019-09-13 LAB — NOVEL CORONAVIRUS, NAA: SARS-CoV-2, NAA: NOT DETECTED

## 2019-09-13 LAB — SARS-COV-2, NAA 2 DAY TAT

## 2019-11-20 ENCOUNTER — Other Ambulatory Visit: Payer: Self-pay

## 2019-11-20 ENCOUNTER — Other Ambulatory Visit: Payer: Medicaid Other

## 2019-11-20 DIAGNOSIS — Z20822 Contact with and (suspected) exposure to covid-19: Secondary | ICD-10-CM

## 2019-11-21 ENCOUNTER — Other Ambulatory Visit: Payer: Self-pay | Admitting: Internal Medicine

## 2019-11-21 ENCOUNTER — Emergency Department (HOSPITAL_COMMUNITY)
Admission: EM | Admit: 2019-11-21 | Discharge: 2019-11-21 | Disposition: A | Discharge status: Home / Self Care | Payer: Medicaid Other | Attending: Emergency Medicine | Admitting: Emergency Medicine

## 2019-11-21 ENCOUNTER — Encounter (HOSPITAL_COMMUNITY): Payer: Self-pay | Admitting: *Deleted

## 2019-11-21 DIAGNOSIS — Z5321 Procedure and treatment not carried out due to patient leaving prior to being seen by health care provider: Secondary | ICD-10-CM | POA: Insufficient documentation

## 2019-11-21 DIAGNOSIS — Z9109 Other allergy status, other than to drugs and biological substances: Secondary | ICD-10-CM

## 2019-11-21 DIAGNOSIS — R0602 Shortness of breath: Secondary | ICD-10-CM | POA: Diagnosis present

## 2019-11-21 NOTE — ED Triage Notes (Addendum)
To ED for eval of sob for past 3-4 days. States his has allergies. 'I know myself. This is allergies'. States these symptoms are usually resolved with inhaler but not working now. Pt appears in nad. No cp. Vaccinated for covid-19. Speaks in full clear sentences. Lung sounds clear on auscultation.

## 2019-11-21 NOTE — Telephone Encounter (Signed)
Patient came into the office and requested for listed medication to be refilled. Please follow up at your earliest convenience. [facility] albuterol (PROVENTIL HFA;VENTOLIN HFA) 108 (90 Base) MCG/ACT inhaler [[id number]] DISCONTINUED

## 2019-11-21 NOTE — ED Notes (Signed)
Pt unable to wait any longer. Pt seen walking out. NO distress noted.

## 2019-11-22 LAB — NOVEL CORONAVIRUS, NAA: SARS-CoV-2, NAA: NOT DETECTED

## 2019-11-22 LAB — SARS-COV-2, NAA 2 DAY TAT

## 2019-11-22 MED FILL — !VENTOLIN HFA INHALER: 108 (90 BAS | 18 days supply | Qty: 18 | Fill #0

## 2020-03-09 ENCOUNTER — Ambulatory Visit: Payer: Medicaid Other

## 2020-03-16 ENCOUNTER — Other Ambulatory Visit: Payer: Self-pay | Admitting: Internal Medicine

## 2020-03-16 MED FILL — VIT D2 1.25 MG (50,000 UNIT: 1.25 MG | 28 days supply | Qty: 4 | Fill #0

## 2020-03-16 MED FILL — FLUTICASONE PROP 50 MCG SPR: 50 | 30 days supply | Qty: 16 | Fill #0

## 2020-03-17 MED FILL — !VENTOLIN HFA INHALER: 108 (90 BAS | 25 days supply | Qty: 18 | Fill #0

## 2020-08-13 ENCOUNTER — Other Ambulatory Visit: Payer: Self-pay

## 2020-08-13 MED ORDER — KETOCONAZOLE 200 MG PO TABS
ORAL_TABLET | Freq: Every day | ORAL | 0 refills | Status: AC
  Filled 2020-08-13: qty 30, 30d supply, fill #0

## 2021-09-10 ENCOUNTER — Other Ambulatory Visit: Payer: Self-pay | Admitting: Internal Medicine

## 2021-09-11 LAB — COMPLETE METABOLIC PANEL WITH GFR
AG Ratio: 1.8 (calc) (ref 1.0–2.5)
ALT: 27 U/L (ref 9–46)
AST: 19 U/L (ref 10–35)
Albumin: 4.3 g/dL (ref 3.6–5.1)
Alkaline phosphatase (APISO): 71 U/L (ref 35–144)
BUN: 18 mg/dL (ref 7–25)
CO2: 25 mmol/L (ref 20–32)
Calcium: 9.5 mg/dL (ref 8.6–10.3)
Chloride: 105 mmol/L (ref 98–110)
Creat: 0.91 mg/dL (ref 0.70–1.30)
Globulin: 2.4 g/dL (calc) (ref 1.9–3.7)
Glucose, Bld: 81 mg/dL (ref 65–99)
Potassium: 4 mmol/L (ref 3.5–5.3)
Sodium: 140 mmol/L (ref 135–146)
Total Bilirubin: 0.5 mg/dL (ref 0.2–1.2)
Total Protein: 6.7 g/dL (ref 6.1–8.1)
eGFR: 103 mL/min/{1.73_m2} (ref >=60)

## 2021-09-11 LAB — LIPID PANEL
Cholesterol: 209 mg/dL — ABNORMAL HIGH (ref <200)
HDL: 55 mg/dL (ref >=40)
LDL Cholesterol (Calc): 134 mg/dL (calc) — ABNORMAL HIGH
Non-HDL Cholesterol (Calc): 154 mg/dL (calc) — ABNORMAL HIGH (ref <130)
Total CHOL/HDL Ratio: 3.8 (calc) (ref <5.0)
Triglycerides: 92 mg/dL (ref <150)

## 2021-09-11 LAB — CBC
HCT: 45.3 % (ref 38.5–50.0)
Hemoglobin: 14.8 g/dL (ref 13.2–17.1)
MCH: 30.4 pg (ref 27.0–33.0)
MCHC: 32.7 g/dL (ref 32.0–36.0)
MCV: 93 fL (ref 80.0–100.0)
MPV: 10.6 fL (ref 7.5–12.5)
Platelets: 184 10*3/uL (ref 140–400)
RBC: 4.87 10*6/uL (ref 4.20–5.80)
RDW: 12.8 % (ref 11.0–15.0)
WBC: 5.5 10*3/uL (ref 3.8–10.8)

## 2021-09-11 LAB — PSA: PSA: 0.57 ng/mL (ref <=4.00)

## 2021-09-11 LAB — VITAMIN D 25 HYDROXY (VIT D DEFICIENCY, FRACTURES): Vit D, 25-Hydroxy: 38 ng/mL (ref 30–100)

## 2021-12-05 ENCOUNTER — Other Ambulatory Visit: Payer: Self-pay

## 2021-12-05 ENCOUNTER — Encounter (HOSPITAL_COMMUNITY): Payer: Self-pay | Admitting: *Deleted

## 2021-12-05 ENCOUNTER — Ambulatory Visit (HOSPITAL_COMMUNITY)
Admission: EM | Admit: 2021-12-05 | Discharge: 2021-12-05 | Disposition: A | Discharge status: Home / Self Care | Payer: Medicaid Other | Attending: Family Medicine | Admitting: Family Medicine

## 2021-12-05 DIAGNOSIS — J029 Acute pharyngitis, unspecified: Secondary | ICD-10-CM | POA: Insufficient documentation

## 2021-12-05 DIAGNOSIS — B349 Viral infection, unspecified: Secondary | ICD-10-CM | POA: Insufficient documentation

## 2021-12-05 LAB — POCT RAPID STREP A, ED / UC: Streptococcus, Group A Screen (Direct): NEGATIVE

## 2021-12-05 MED ORDER — KETOROLAC TROMETHAMINE 30 MG/ML IJ SOLN
30.0000 mg | Freq: Once | INTRAMUSCULAR | Status: AC
  Administered 2021-12-05: 30 mg via INTRAMUSCULAR

## 2021-12-05 MED ORDER — KETOROLAC TROMETHAMINE 30 MG/ML IJ SOLN
INTRAMUSCULAR | Status: AC
  Filled 2021-12-05: qty 1

## 2021-12-05 MED ORDER — OMEPRAZOLE 40 MG PO CPDR: 40.0000 mg | DELAYED_RELEASE_CAPSULE | Freq: Every day | ORAL | 0 refills | Status: AC

## 2021-12-05 MED ORDER — IBUPROFEN 800 MG PO TABS: 800.0000 mg | ORAL_TABLET | Freq: Three times a day (TID) | ORAL | 0 refills | Status: AC | PRN

## 2021-12-05 NOTE — Discharge Instructions (Addendum)
Your strep test is negative.  Culture of the throat will be sent, and staff will notify you if that is in turn positive.   You have been swabbed for COVID and for flu, and the test will result in the next 24 hours. Our staff will call you if positive. If the COVID test is positive, you should quarantine for 5 days from the start of your symptoms  You have been given a shot of Toradol 30 mg today.  Take ibuprofen 800 mg--1 tab every 8 hours as needed for pain.  Take omeprazole 40 mg--1 capsule daily for stomach acid.

## 2021-12-05 NOTE — ED Triage Notes (Addendum)
C/O body aches, HA, fatigue, chills and sore throat onset yesterday. Also c/o "stomach burning" -- states feels same as when he had "a stomach bacteria" years ago and wishes to be tested for stomach bacteria again - per record, pt had H. pylori.

## 2021-12-05 NOTE — ED Provider Notes (Signed)
[facility]    CSN: [id number] Arrival date & time: 12/05/21  1027      History   Chief Complaint No chief complaint on file.   HPI Kyle Herrera is a 51 y.o. male.   HPI Here for sore throat, myalgia, and chills.  Symptoms began yesterday; also since yesterday he has had some burning in his stomach.  He states the stomach burning feels similar to when he had H. pylori about 4 years ago, and he would like testing for it today.  On review of the chart I can see a positive H. pylori breath test in 2019, ordered by [facility].  He has not had any fever or cough since the symptoms began yesterday.  Past Medical History:  Diagnosis Date   Allergy    H. pylori infection     Patient Active Problem List   Diagnosis Date Noted   Hyperlipidemia 02/27/2018   Alopecia areata 11/16/2015   Environmental allergies 01/22/2015    Past Surgical History:  Procedure Laterality Date   TONSILLECTOMY         Home Medications    Prior to Admission medications   Medication Sig Start Date End Date Taking? Authorizing Provider  ibuprofen (ADVIL) 800 MG tablet Take 1 tablet (800 mg total) by mouth every 8 (eight) hours as needed (pain). 12/05/21  Yes Barrett Henle, MD  omeprazole (PRILOSEC) 40 MG capsule Take 1 capsule (40 mg total) by mouth daily. 12/05/21  Yes Katheline Brendlinger, Gwenlyn Perking, MD  VITAMIN D PO Take by mouth. weekly   Yes [provider]  albuterol (VENTOLIN HFA) 108 (90 Base) MCG/ACT inhaler INHALE 2 PUFFS INTO THE LUNGS EVERY 4 (FOUR) HOURS AS NEEDED FOR WHEEZING OR SHORTNESS OF BREATH. 11/21/19   Ladell Pier, MD  Biotin w/ Vitamins C & E (HAIR SKIN & NAILS GUMMIES) 1250-7.5-7.5 MCG-MG-UNT CHEW Chew 1 tablet by mouth daily.    [provider]  fluticasone Asencion Islam) 50 MCG/ACT nasal spray PLACE 2 SPRAYS IN EACH NOSTRIL ONCE DAILY 03/16/20 03/16/21  Nolene Ebbs, MD  ketoconazole (NIZORAL) 200 MG tablet 1 po qd 08/12/20        Family History Family History  Problem Relation Age of Onset   Colon cancer Neg Hx    Esophageal cancer Neg Hx    Stomach cancer Neg Hx     Social History Social History   Tobacco Use   Smoking status: Some Days    Types: Cigarettes   Smokeless tobacco: Never   Tobacco comments:    smokes occasionally   Vaping Use   Vaping Use: Never used  Substance Use Topics   Alcohol use: No   Drug use: No     Allergies   Patient has no known allergies.   Review of Systems Review of Systems   Physical Exam Triage Vital Signs ED Triage Vitals  Enc Vitals Group     BP 12/05/21 1101 115/66     Pulse Rate 12/05/21 1101 (!) 108     Resp 12/05/21 1101 18     Temp 12/05/21 1101 99.3 F (37.4 C)     Temp Source 12/05/21 1101 Oral     SpO2 12/05/21 1101 95 %     Weight --      Height --      Head Circumference --      Peak Flow --      Pain Score 12/05/21 1103 8     Pain  Loc --      Pain Edu? --      Excl. in [place]? --    No data found.  Updated Vital Signs BP 115/66   Pulse (!) 108   Temp 99.3 F (37.4 C) (Oral)   Resp 18   SpO2 95%   Visual Acuity Right Eye Distance:   Left Eye Distance:   Bilateral Distance:    Right Eye Near:   Left Eye Near:    Bilateral Near:     Physical Exam Vitals reviewed.  Constitutional:      General: He is not in acute distress.    Appearance: He is not toxic-appearing.  HENT:     Right Ear: Tympanic membrane and ear canal normal.     Left Ear: Tympanic membrane and ear canal normal.     Nose: Nose normal.     Mouth/Throat:     Mouth: Mucous membranes are moist.     Comments: There is erythema of the posterior oropharynx and some clear mucus draining. Eyes:     Extraocular Movements: Extraocular movements intact.     Conjunctiva/sclera: Conjunctivae normal.     Pupils: Pupils are equal, round, and reactive to light.  Cardiovascular:     Rate and Rhythm: Normal rate and regular rhythm.     Heart sounds: No murmur  heard. Pulmonary:     Effort: Pulmonary effort is normal.     Breath sounds: Normal breath sounds.  Abdominal:     Palpations: Abdomen is soft.     Tenderness: There is abdominal tenderness (mild in epigastrium).  Musculoskeletal:     Cervical back: Neck supple.  Lymphadenopathy:     Cervical: No cervical adenopathy.  Skin:    Capillary Refill: Capillary refill takes less than 2 seconds.     Coloration: Skin is not jaundiced or pale.  Neurological:     General: No focal deficit present.     Mental Status: He is alert and oriented to person, place, and time.  Psychiatric:        Behavior: Behavior normal.      UC Treatments / Results  Labs (all labs ordered are listed, but only abnormal results are displayed) Labs Reviewed  CULTURE, GROUP A STREP ([place])  RESP PANEL BY RT-PCR (FLU A&B, COVID) ARPGX2  POCT RAPID STREP A, ED / UC    EKG   Radiology No results found.  Procedures Procedures (including critical care time)  Medications Ordered in UC Medications  ketorolac (TORADOL) 30 MG/ML injection 30 mg (has no administration in time range)    Initial Impression / Assessment and Plan / UC Course  I have reviewed the triage vital signs and the nursing notes.  Pertinent labs & imaging results that were available during my care of the patient were reviewed by me and considered in my medical decision making (see chart for details).        Strep test is negative, so we will send culture and treat per protocol if positive  With his myalgia and other symptoms, he is swabbed for COVID and for flu.  If positive for COVID he should quarantine.  If positive for flu he would benefit from a prescription for Tamiflu.  Last EGFR was normal in June of this year  I discussed with him that we would not be able to do any H. pylori testing for him here, as we do not order breath test, and his blood test would be positive for most of  his life.  I asked him to follow-up with his  primary care, and PPIs sent in for his symptoms for 2 weeks Final Clinical Impressions(s) / UC Diagnoses   Final diagnoses:  Viral syndrome  Sore throat     Discharge Instructions      Your strep test is negative.  Culture of the throat will be sent, and staff will notify you if that is in turn positive.   You have been swabbed for COVID and for flu, and the test will result in the next 24 hours. Our staff will call you if positive. If the COVID test is positive, you should quarantine for 5 days from the start of your symptoms  You have been given a shot of Toradol 30 mg today.  Take ibuprofen 800 mg--1 tab every 8 hours as needed for pain.  Take omeprazole 40 mg--1 capsule daily for stomach acid.       ED Prescriptions     Medication Sig Dispense Auth. Provider   omeprazole (PRILOSEC) 40 MG capsule Take 1 capsule (40 mg total) by mouth daily. 15 capsule Barrett Henle, MD   ibuprofen (ADVIL) 800 MG tablet Take 1 tablet (800 mg total) by mouth every 8 (eight) hours as needed (pain). 21 tablet Ajit Errico, Gwenlyn Perking, MD      PDMP not reviewed this encounter.   Barrett Henle, MD 12/05/21 [phone]

## 2021-12-07 ENCOUNTER — Telehealth (HOSPITAL_COMMUNITY): Payer: Self-pay | Admitting: Emergency Medicine

## 2021-12-07 LAB — CULTURE, GROUP A STREP (THRC)

## 2021-12-07 NOTE — Telephone Encounter (Signed)
Received a message that patient is looking for the results of his COVID test.  Did not see results in chart after 2 days, so I called down to the lab.  They state they never received the sample.   Attempted to reach patient (number on file appears to be for son), but after three rings it states the number is not in service.   Patient will need to return for recollect of his COVID swab

## 2021-12-08 ENCOUNTER — Telehealth (HOSPITAL_COMMUNITY): Payer: Self-pay | Admitting: Emergency Medicine

## 2021-12-08 NOTE — Telephone Encounter (Signed)
Went to speak to patient access about the patient due to not being able to reach him and him being so upset.  We reviewed the call log and did get another number for the patient.  I attempted to call him at the new number.  When he answered he began to express his dissatisfaction with the delay.  I listened and apologized and then explained the situation about having the wrong number on file.  Patient proceeds to explain that he has a family and the delay has been very upsetting to him.  He asks me not to say anything else, just to give him the results.  I explained that we did not have results and he would need to come back for a recollect, and he said "oh my God, just nevermind then" and ended the call.

## 2021-12-08 NOTE — Telephone Encounter (Signed)
Patient called back to the office this morning and spoke to registration.  He was very upset that no one called him yesterday.  Per telephone note from this RN, this RN did attempt patient.  I confirmed the number on the file is the same as the number provided on the note from patient access, and then attempted it multiple times in a row.  It rings 3-5 times each time, and then a message "this number you have dialed is not in service".  Attempted 4 times in a row without success.   '

## 2022-01-17 ENCOUNTER — Other Ambulatory Visit: Payer: Self-pay | Admitting: Internal Medicine

## 2022-01-18 LAB — LIPID PANEL
Cholesterol: 211 mg/dL — ABNORMAL HIGH (ref <200)
HDL: 61 mg/dL (ref >=40)
LDL Cholesterol (Calc): 132 mg/dL (calc) — ABNORMAL HIGH
Non-HDL Cholesterol (Calc): 150 mg/dL (calc) — ABNORMAL HIGH (ref <130)
Total CHOL/HDL Ratio: 3.5 (calc) (ref <5.0)
Triglycerides: 79 mg/dL (ref <150)

## 2022-01-18 LAB — EXTRA LAV TOP TUBE

## 2022-05-25 ENCOUNTER — Other Ambulatory Visit: Payer: Self-pay | Admitting: Internal Medicine

## 2022-05-26 LAB — LIPID PANEL
Cholesterol: 207 mg/dL — ABNORMAL HIGH (ref <200)
HDL: 46 mg/dL (ref >=40)
LDL Cholesterol (Calc): 137 mg/dL (calc) — ABNORMAL HIGH
Non-HDL Cholesterol (Calc): 161 mg/dL (calc) — ABNORMAL HIGH (ref <130)
Total CHOL/HDL Ratio: 4.5 (calc) (ref <5.0)
Triglycerides: 120 mg/dL (ref <150)

## 2022-05-26 LAB — EXTRA LAV TOP TUBE

## 2022-08-20 ENCOUNTER — Other Ambulatory Visit (HOSPITAL_BASED_OUTPATIENT_CLINIC_OR_DEPARTMENT_OTHER): Payer: Self-pay

## 2022-09-23 ENCOUNTER — Other Ambulatory Visit: Payer: Self-pay | Admitting: Internal Medicine

## 2022-09-24 LAB — LIPID PANEL
Cholesterol: 177 mg/dL (ref <200)
HDL: 50 mg/dL (ref >=40)
LDL Cholesterol (Calc): 107 mg/dL (calc) — ABNORMAL HIGH
Non-HDL Cholesterol (Calc): 127 mg/dL (calc) (ref <130)
Total CHOL/HDL Ratio: 3.5 (calc) (ref <5.0)
Triglycerides: 103 mg/dL (ref <150)

## 2022-09-24 LAB — COMPLETE METABOLIC PANEL WITH GFR
AG Ratio: 1.7 (calc) (ref 1.0–2.5)
ALT: 13 U/L (ref 9–46)
AST: 15 U/L (ref 10–35)
Albumin: 4.3 g/dL (ref 3.6–5.1)
Alkaline phosphatase (APISO): 72 U/L (ref 35–144)
BUN: 15 mg/dL (ref 7–25)
CO2: 25 mmol/L (ref 20–32)
Calcium: 9.3 mg/dL (ref 8.6–10.3)
Chloride: 107 mmol/L (ref 98–110)
Creat: 0.77 mg/dL (ref 0.70–1.30)
Globulin: 2.5 g/dL (calc) (ref 1.9–3.7)
Glucose, Bld: 71 mg/dL (ref 65–99)
Potassium: 4.2 mmol/L (ref 3.5–5.3)
Sodium: 141 mmol/L (ref 135–146)
Total Bilirubin: 0.6 mg/dL (ref 0.2–1.2)
Total Protein: 6.8 g/dL (ref 6.1–8.1)
eGFR: 108 mL/min/{1.73_m2} (ref >=60)

## 2022-09-24 LAB — CBC
HCT: 41.7 % (ref 38.5–50.0)
Hemoglobin: 13.7 g/dL (ref 13.2–17.1)
MCH: 30.8 pg (ref 27.0–33.0)
MCHC: 32.9 g/dL (ref 32.0–36.0)
MCV: 93.7 fL (ref 80.0–100.0)
MPV: 10.5 fL (ref 7.5–12.5)
Platelets: 153 10*3/uL (ref 140–400)
RBC: 4.45 10*6/uL (ref 4.20–5.80)
RDW: 12.8 % (ref 11.0–15.0)
WBC: 5.6 10*3/uL (ref 3.8–10.8)

## 2022-09-24 LAB — PSA: PSA: 0.56 ng/mL (ref <=4.00)

## 2022-09-24 LAB — VITAMIN D 25 HYDROXY (VIT D DEFICIENCY, FRACTURES): Vit D, 25-Hydroxy: 82 ng/mL (ref 30–100)
